# Patient Record
Sex: Female | Born: 1985 | Race: White | Hispanic: No | Marital: Single | Smoking: Current every day smoker
Health system: Southern US, Community
[De-identification: ages and names within clinical notes are randomized; demographics above are authoritative.]

## PROBLEM LIST (undated history)

## (undated) DIAGNOSIS — F32A Depression, unspecified: Secondary | ICD-10-CM

## (undated) DIAGNOSIS — F329 Major depressive disorder, single episode, unspecified: Secondary | ICD-10-CM

## (undated) DIAGNOSIS — D649 Anemia, unspecified: Secondary | ICD-10-CM

## (undated) DIAGNOSIS — N289 Disorder of kidney and ureter, unspecified: Secondary | ICD-10-CM

## (undated) DIAGNOSIS — F419 Anxiety disorder, unspecified: Secondary | ICD-10-CM

## (undated) HISTORY — PX: LITHOTRIPSY: SUR834

---

## 2018-03-19 ENCOUNTER — Emergency Department (HOSPITAL_COMMUNITY)
Admission: EM | Admit: 2018-03-19 | Discharge: 2018-03-19 | Disposition: A | Discharge status: Home / Self Care | Payer: Medicaid Other | Attending: Emergency Medicine | Admitting: Emergency Medicine

## 2018-03-19 ENCOUNTER — Other Ambulatory Visit: Payer: Self-pay

## 2018-03-19 ENCOUNTER — Encounter (HOSPITAL_COMMUNITY): Payer: Self-pay | Admitting: Emergency Medicine

## 2018-03-19 DIAGNOSIS — K0889 Other specified disorders of teeth and supporting structures: Secondary | ICD-10-CM | POA: Diagnosis present

## 2018-03-19 DIAGNOSIS — F17210 Nicotine dependence, cigarettes, uncomplicated: Secondary | ICD-10-CM | POA: Diagnosis not present

## 2018-03-19 DIAGNOSIS — J019 Acute sinusitis, unspecified: Secondary | ICD-10-CM | POA: Diagnosis not present

## 2018-03-19 HISTORY — DX: Anemia, unspecified: D64.9

## 2018-03-19 HISTORY — DX: Disorder of kidney and ureter, unspecified: N28.9

## 2018-03-19 HISTORY — DX: Depression, unspecified: F32.A

## 2018-03-19 HISTORY — DX: Anxiety disorder, unspecified: F41.9

## 2018-03-19 HISTORY — DX: Major depressive disorder, single episode, unspecified: F32.9

## 2018-03-19 MED ORDER — IBUPROFEN 400 MG PO TABS
600.0000 mg | ORAL_TABLET | Freq: Once | ORAL | Status: AC
  Administered 2018-03-19: 600 mg via ORAL
  Filled 2018-03-19: qty 2

## 2018-03-19 MED ORDER — CLINDAMYCIN HCL 150 MG PO CAPS: 150.0000 mg | ORAL_CAPSULE | Freq: Four times a day (QID) | ORAL | 0 refills | Status: DC

## 2018-03-19 MED ORDER — LORATADINE-PSEUDOEPHEDRINE ER 5-120 MG PO TB12: 1.0000 | ORAL_TABLET | Freq: Two times a day (BID) | ORAL | 0 refills | Status: DC

## 2018-03-19 MED ORDER — CLINDAMYCIN HCL 150 MG PO CAPS
300.0000 mg | ORAL_CAPSULE | Freq: Once | ORAL | Status: AC
  Administered 2018-03-19: 300 mg via ORAL
  Filled 2018-03-19: qty 2

## 2018-03-19 MED ORDER — ONDANSETRON HCL 4 MG PO TABS
4.0000 mg | ORAL_TABLET | Freq: Once | ORAL | Status: AC
  Administered 2018-03-19: 4 mg via ORAL
  Filled 2018-03-19: qty 1

## 2018-03-19 NOTE — Discharge Instructions (Addendum)
Please use clindamycin with breakfast, lunch, dinner, and at bedtime.  Take this daily until all taken.  Please use Claritin-D every 12 hours for sinus congestion and sinus pain.  Use 600 mg of ibuprofen with breakfast, lunch, dinner, and at bedtime.  It is important that she see a dentist as soon as possible.

## 2018-03-19 NOTE — ED Provider Notes (Addendum)
[facility] EMERGENCY DEPARTMENT Provider Note   CSN: [id number] Arrival date & time: 03/19/18  1817     History   Chief Complaint Chief Complaint  Patient presents with  . Dental Pain    HPI Ashley David is a 32 y.o. female.  Pt c/o increasing pain of the left face, behind the eye and the ear. She has nausea today.  The history is provided by the patient.  Dental Pain   This is a new problem. The current episode started more than 1 week ago. The problem occurs daily. The problem has been gradually worsening. The pain is moderate. She has tried acetaminophen for the symptoms.       Past Medical History:  Diagnosis Date  . Anemia   . Anxiety   . Depression   . Renal disorder    kidney stones    There are no active problems to display for this patient.   Past Surgical History:  Procedure Laterality Date  . LITHOTRIPSY       OB History   None      Home Medications    Prior to Admission medications   Not on File    Family History No family history on file.  Social History Social History   Tobacco Use  . Smoking status: Current Every Day Smoker    Packs/day: 1.00    Types: Cigarettes  . Smokeless tobacco: Never Used  Substance Use Topics  . Alcohol use: Not Currently  . Drug use: Not Currently     Allergies   Penicillins   Review of Systems Review of Systems  Constitutional: Negative for activity change.       All ROS Neg except as noted in HPI  HENT: Positive for congestion and dental problem. Negative for nosebleeds.   Eyes: Negative for photophobia and discharge.  Respiratory: Negative for cough, shortness of breath and wheezing.   Cardiovascular: Negative for chest pain and palpitations.  Gastrointestinal: Negative for abdominal pain and blood in stool.  Genitourinary: Negative for dysuria, frequency and hematuria.  Musculoskeletal: Negative for arthralgias, back pain and neck pain.  Skin: Negative.   Neurological: Negative  for dizziness, seizures and speech difficulty.  Psychiatric/Behavioral: Negative for confusion and hallucinations.     Physical Exam Updated Vital Signs BP 120/85 (BP Location: Right Arm)   Pulse 95   Temp 98.7 F (37.1 C) (Oral)   Resp 16   Ht 5\' 1"  (1.549 m)   Wt 62.1 kg (137 lb)   LMP 03/02/2018 (Approximate)   SpO2 98%   BMI 25.89 kg/m   Physical Exam  Constitutional: She is oriented to person, place, and time. She appears well-developed and well-nourished.  Non-toxic appearance.  HENT:  Head: Normocephalic.  Right Ear: Tympanic membrane and external ear normal.  Left Ear: Tympanic membrane and external ear normal.  There is pain to percussion over the sinuses on the left.  There is also pain to percussion of the left lower molars.  There is a cavity noted of 1 of the lower molars.  There is mild swelling about the lower gum.  There is no swelling under the tongue.  The airway is patent.  There are no pre-or post auricular nodes appreciated.  There is no mastoid involvement.  Eyes: Pupils are equal, round, and reactive to light. EOM and lids are normal.  Neck: Normal range of motion. Neck supple. Carotid bruit is not present.  Cardiovascular: Normal rate, regular rhythm, normal heart sounds, intact distal  pulses and normal pulses.  Pulmonary/Chest: Breath sounds normal. No respiratory distress.  Abdominal: Soft. Bowel sounds are normal. There is no tenderness. There is no guarding.  Musculoskeletal: Normal range of motion.  Lymphadenopathy:       Head (right side): No submandibular adenopathy present.       Head (left side): No submandibular adenopathy present.    She has no cervical adenopathy.  Neurological: She is alert and oriented to person, place, and time. She has normal strength. No cranial nerve deficit or sensory deficit.  Skin: Skin is warm and dry.  Psychiatric: She has a normal mood and affect. Her speech is normal.  Nursing note and vitals  reviewed.    ED Treatments / Results  Labs (all labs ordered are listed, but only abnormal results are displayed) Labs Reviewed - No data to display  EKG None  Radiology No results found.  Procedures Procedures (including critical care time)  Medications Ordered in ED Medications - No data to display   Initial Impression / Assessment and Plan / ED Course  I have reviewed the triage vital signs and the nursing notes.  Pertinent labs & imaging results that were available during my care of the patient were reviewed by me and considered in my medical decision making (see chart for details).       Final Clinical Impressions(s) / ED Diagnoses MDM  Vital signs within normal limits.  Patient has pain to percussion over the sinuses.  There is also noted dental caries on the left.  There is no evidence for Ludewig's angina.  Patient will be treated with clindamycin and ibuprofen for the dental discomfort.  Patient will use Claritin-D for the sinus congestion.  Patient is to follow-up with her primary physician or return to the emergency department if any changes in condition, problems, or concerns.   Final diagnoses:  Pain, dental  Acute sinusitis, recurrence not specified, unspecified location    ED Discharge Orders        Ordered    clindamycin (CLEOCIN) 150 MG capsule  Every 6 hours     03/19/18 2101    loratadine-pseudoephedrine (CLARITIN-D 12 HOUR) 5-120 MG tablet  2 times daily     03/19/18 2101       Ivery QualeBryant, Sherwood Castilla, PA-C 03/19/18 2108    Ivery QualeBryant, Era Parr, PA-C 03/22/18 [id number]    Donnetta Hutchingook, Brian, MD 03/24/18 2149

## 2018-03-19 NOTE — ED Triage Notes (Signed)
Pt c/o LT sided dental pain x 2 weeks. States pain radiates to LT ear and LT eye. Pt endorses nausea.

## 2018-08-27 ENCOUNTER — Other Ambulatory Visit: Payer: Self-pay

## 2018-08-27 ENCOUNTER — Encounter (HOSPITAL_COMMUNITY): Payer: Self-pay | Admitting: Emergency Medicine

## 2018-08-27 ENCOUNTER — Emergency Department (HOSPITAL_COMMUNITY)
Admission: EM | Admit: 2018-08-27 | Discharge: 2018-08-27 | Disposition: A | Discharge status: Home / Self Care | Payer: Medicaid Other | Attending: Emergency Medicine | Admitting: Emergency Medicine

## 2018-08-27 ENCOUNTER — Emergency Department (HOSPITAL_COMMUNITY): Payer: Medicaid Other

## 2018-08-27 DIAGNOSIS — Z79899 Other long term (current) drug therapy: Secondary | ICD-10-CM | POA: Diagnosis not present

## 2018-08-27 DIAGNOSIS — R1013 Epigastric pain: Secondary | ICD-10-CM | POA: Insufficient documentation

## 2018-08-27 DIAGNOSIS — F17210 Nicotine dependence, cigarettes, uncomplicated: Secondary | ICD-10-CM | POA: Insufficient documentation

## 2018-08-27 LAB — URINALYSIS, ROUTINE W REFLEX MICROSCOPIC
BILIRUBIN URINE: NEGATIVE
Glucose, UA: NEGATIVE mg/dL
Hgb urine dipstick: NEGATIVE
Ketones, ur: 20 mg/dL — AB
LEUKOCYTES UA: NEGATIVE
NITRITE: NEGATIVE
Protein, ur: NEGATIVE mg/dL
Specific Gravity, Urine: 1.017 (ref 1.005–1.030)
pH: 8 (ref 5.0–8.0)

## 2018-08-27 LAB — COMPREHENSIVE METABOLIC PANEL
ALK PHOS: 57 U/L (ref 38–126)
ALT: 15 U/L (ref 0–44)
AST: 16 U/L (ref 15–41)
Albumin: 4.4 g/dL (ref 3.5–5.0)
Anion gap: 6 (ref 5–15)
BILIRUBIN TOTAL: 0.7 mg/dL (ref 0.3–1.2)
BUN: 10 mg/dL (ref 6–20)
CALCIUM: 9.2 mg/dL (ref 8.9–10.3)
CO2: 26 mmol/L (ref 22–32)
Chloride: 106 mmol/L (ref 98–111)
Creatinine, Ser: 0.56 mg/dL (ref 0.44–1.00)
Glucose, Bld: 84 mg/dL (ref 70–99)
Potassium: 3.6 mmol/L (ref 3.5–5.1)
Sodium: 138 mmol/L (ref 135–145)
TOTAL PROTEIN: 7.4 g/dL (ref 6.5–8.1)

## 2018-08-27 LAB — CBC
HCT: 37.1 % (ref 36.0–46.0)
Hemoglobin: 12.1 g/dL (ref 12.0–15.0)
MCH: 30.3 pg (ref 26.0–34.0)
MCHC: 32.6 g/dL (ref 30.0–36.0)
MCV: 92.8 fL (ref 78.0–100.0)
Platelets: 375 10*3/uL (ref 150–400)
RBC: 4 MIL/uL (ref 3.87–5.11)
RDW: 13.2 % (ref 11.5–15.5)
WBC: 6.5 10*3/uL (ref 4.0–10.5)

## 2018-08-27 LAB — PREGNANCY, URINE: PREG TEST UR: NEGATIVE

## 2018-08-27 MED ORDER — ONDANSETRON HCL 4 MG/2ML IJ SOLN
4.0000 mg | Freq: Once | INTRAMUSCULAR | Status: AC
  Administered 2018-08-27: 4 mg via INTRAVENOUS
  Filled 2018-08-27: qty 2

## 2018-08-27 MED ORDER — TRAMADOL HCL 50 MG PO TABS: 50.0000 mg | ORAL_TABLET | Freq: Four times a day (QID) | ORAL | 0 refills | Status: DC | PRN

## 2018-08-27 MED ORDER — FENTANYL CITRATE (PF) 100 MCG/2ML IJ SOLN
25.0000 ug | Freq: Once | INTRAMUSCULAR | Status: AC
  Administered 2018-08-27: 25 ug via INTRAVENOUS
  Filled 2018-08-27: qty 2

## 2018-08-27 MED ORDER — ONDANSETRON 4 MG PO TBDP: 4.0000 mg | ORAL_TABLET | Freq: Three times a day (TID) | ORAL | 1 refills | Status: DC | PRN

## 2018-08-27 MED ORDER — FAMOTIDINE 20 MG PO TABS: 20.0000 mg | ORAL_TABLET | Freq: Two times a day (BID) | ORAL | 0 refills | Status: DC

## 2018-08-27 MED ORDER — SODIUM CHLORIDE 0.9 % IV SOLN
INTRAVENOUS | Status: DC
  Administered 2018-08-27: 15:00:00 via INTRAVENOUS

## 2018-08-27 MED ORDER — SODIUM CHLORIDE 0.9 % IV BOLUS
1000.0000 mL | Freq: Once | INTRAVENOUS | Status: AC
  Administered 2018-08-27: 1000 mL via INTRAVENOUS

## 2018-08-27 MED ORDER — IOPAMIDOL (ISOVUE-300) INJECTION 61%
100.0000 mL | Freq: Once | INTRAVENOUS | Status: AC | PRN
  Administered 2018-08-27: 100 mL via INTRAVENOUS

## 2018-08-27 NOTE — ED Triage Notes (Signed)
Pt states she has an IUD, in the last 4 months she has had bleeding everyday except maybe 4 days. Nausea, pain radiating in right flank.

## 2018-08-27 NOTE — Discharge Instructions (Signed)
Take the Pepcid as directed for the next 2 weeks.  Take the tramadol as needed for the pain.  Take Zofran as needed for the nausea and vomiting.  Today's work-up without any significant findings.  Return for any new or worse symptoms.

## 2018-08-27 NOTE — ED Provider Notes (Signed)
[facility] EMERGENCY DEPARTMENT Provider Note   CSN: [id number] Arrival date & time: 08/27/18  9562     History   Chief Complaint Chief Complaint  Patient presents with  . Abdominal Pain    HPI Ashley David is a 32 y.o. female.  Patient presents with a complaint of epigastric abdominal pain that radiates to the right flank.  Associated with nausea.  Some vomiting.  The symptoms have been ongoing for a week.  No fevers.  No dysuria.  No history of similar pain.  In addition patient has an IUD in place and she is been having heavy irregular bleeding for the past 4 months.  She does have an appointment already scheduled with [facility] for evaluation of this next week.  She does not feel that the abdominal pain is related to this.     Past Medical History:  Diagnosis Date  . Anemia   . Anxiety   . Depression   . Renal disorder    kidney stones    There are no active problems to display for this patient.   Past Surgical History:  Procedure Laterality Date  . LITHOTRIPSY       OB History   None      Home Medications    Prior to Admission medications   Medication Sig Start Date End Date Taking? Authorizing Provider  acetaminophen (TYLENOL) 500 MG tablet Take 500 mg by mouth every 6 (six) hours as needed for mild pain.   Yes [provider]  levonorgestrel (MIRENA) 20 MCG/24HR IUD 1 each by Intrauterine route once.   Yes [provider]  famotidine (PEPCID) 20 MG tablet Take 1 tablet (20 mg total) by mouth 2 (two) times daily. 08/27/18   Vanetta Mulders, MD  ondansetron (ZOFRAN ODT) 4 MG disintegrating tablet Take 1 tablet (4 mg total) by mouth every 8 (eight) hours as needed. 08/27/18   Vanetta Mulders, MD  traMADol (ULTRAM) 50 MG tablet Take 1 tablet (50 mg total) by mouth every 6 (six) hours as needed. 08/27/18   Vanetta Mulders, MD    Family History No family history on file.  Social History Social History   Tobacco Use  .  Smoking status: Current Every Day Smoker    Packs/day: 1.00    Types: Cigarettes  . Smokeless tobacco: Never Used  Substance Use Topics  . Alcohol use: Not Currently  . Drug use: Not Currently     Allergies   Penicillins   Review of Systems Review of Systems  Constitutional: Negative for fever.  HENT: Negative for congestion.   Eyes: Negative for redness.  Respiratory: Negative for shortness of breath.   Cardiovascular: Negative for chest pain.  Gastrointestinal: Positive for abdominal pain, nausea and vomiting.  Genitourinary: Positive for flank pain and vaginal bleeding. Negative for dysuria.  Musculoskeletal: Negative for back pain and myalgias.  Skin: Negative for rash.  Neurological: Negative for syncope.  Hematological: Does not bruise/bleed easily.  Psychiatric/Behavioral: Negative for confusion.     Physical Exam Updated Vital Signs BP 117/69 (BP Location: Left Arm)   Pulse 88   Temp 99.3 F (37.4 C) (Oral)   Resp 16   Ht 1.549 m (5\' 1" )   Wt 60 kg   SpO2 100%   BMI 25.00 kg/m   Physical Exam  Constitutional: She is oriented to person, place, and time. She appears well-developed and well-nourished. No distress.  HENT:  Head: Normocephalic and atraumatic.  Mouth/Throat: Oropharynx is clear and  moist.  Eyes: Pupils are equal, round, and reactive to light. Conjunctivae and EOM are normal.  Neck: Normal range of motion. Neck supple.  Cardiovascular: Normal rate, regular rhythm and normal heart sounds.  Pulmonary/Chest: Effort normal and breath sounds normal. No respiratory distress.  Abdominal: Soft. Bowel sounds are normal. There is tenderness.  Epigastric right upper quadrant area  Musculoskeletal: Normal range of motion. She exhibits no edema.  Neurological: She is alert and oriented to person, place, and time. No cranial nerve deficit or sensory deficit. She exhibits normal muscle tone. Coordination normal.  Skin: Skin is warm. Capillary refill takes  less than 2 seconds. No rash noted.  Nursing note and vitals reviewed.    ED Treatments / Results  Labs (all labs ordered are listed, but only abnormal results are displayed) Labs Reviewed  URINALYSIS, ROUTINE W REFLEX MICROSCOPIC - Abnormal; Notable for the following components:      Result Value   Ketones, ur 20 (*)    All other components within normal limits  COMPREHENSIVE METABOLIC PANEL  CBC  PREGNANCY, URINE    EKG None  Radiology Ct Abdomen Pelvis W Contrast  Result Date: 08/27/2018 CLINICAL DATA:  Epigastric pain for 1.5 weeks. EXAM: CT ABDOMEN AND PELVIS WITH CONTRAST TECHNIQUE: Multidetector CT imaging of the abdomen and pelvis was performed using the standard protocol following bolus administration of intravenous contrast. CONTRAST:  ISOVUE-300 IOPAMIDOL (ISOVUE-300) INJECTION 61% COMPARISON:  None. FINDINGS: Lower chest: Minimal atelectasis is identified in the right lung base. The heart size is normal. Hepatobiliary: There is low density of liver along the falciform ligament consistent with focal fatty infiltration of liver. No focal liver lesions identified. The gallbladder and biliary tree are normal. Pancreas: Unremarkable. No pancreatic ductal dilatation or surrounding inflammatory changes. Spleen: Normal in size without focal abnormality. Adrenals/Urinary Tract: Adrenal glands are unremarkable. Kidneys are normal, without renal calculi, focal lesion, or hydronephrosis. Bladder is unremarkable. Stomach/Bowel: There is a small hiatal hernia. Stomach is otherwise within normal limits. Appendix appears normal. No evidence of bowel wall thickening, distention, or inflammatory changes. Vascular/Lymphatic: No significant vascular findings are present. No enlarged abdominal or pelvic lymph nodes. Reproductive: Uterus and bilateral adnexa are unremarkable. IUD is identified in the uterus. Other: None. Musculoskeletal: No acute or significant osseous findings. IMPRESSION: No  acute abnormality identified in the abdomen and pelvis. No CT evidence of pancreatitis. Focal fatty infiltration of liver. Small hiatal hernia. Electronically Signed   By: Sherian Rein M.D.   On: 08/27/2018 16:28    Procedures Procedures (including critical care time)  Medications Ordered in ED Medications  0.9 %  sodium chloride infusion ( Intravenous New Bag/Given 08/27/18 1523)  ondansetron (ZOFRAN) injection 4 mg (4 mg Intravenous Given 08/27/18 1522)  sodium chloride 0.9 % bolus 1,000 mL (0 mLs Intravenous Stopped 08/27/18 1646)  fentaNYL (SUBLIMAZE) injection 25 mcg (25 mcg Intravenous Given 08/27/18 1522)  iopamidol (ISOVUE-300) 61 % injection 100 mL (100 mLs Intravenous Contrast Given 08/27/18 1558)     Initial Impression / Assessment and Plan / ED Course  I have reviewed the triage vital signs and the nursing notes.  Pertinent labs & imaging results that were available during my care of the patient were reviewed by me and considered in my medical decision making (see chart for details).    Patient's work-up for the new epigastric and right flank pain without any significant findings on CT scan of the abdomen.  Labs without significant abnormalities.  Urinalysis pregnancy test negative  CBC complete metabolic panel including hepatic functions without any significant abnormalities.  We will treat patient symptomatically.  Patient given Zofran for the nausea.  And a short course of tramadol for the pain.  Since pain was predominantly epigastric and CT scan can miss ulcers also treat patient with a 2-week course of Pepcid.  Patient will follow-up with GYN next week.  She also has primary care that she can follow-up with.  She will return for any new or worse symptoms.  Patient nontoxic no acute distress.  Final Clinical Impressions(s) / ED Diagnoses   Final diagnoses:  Epigastric pain    ED Discharge Orders         Ordered    traMADol (ULTRAM) 50 MG tablet  Every 6 hours PRN      08/27/18 1715    ondansetron (ZOFRAN ODT) 4 MG disintegrating tablet  Every 8 hours PRN     08/27/18 1715    famotidine (PEPCID) 20 MG tablet  2 times daily     08/27/18 1715           Vanetta Mulders, MD 08/27/18 2155

## 2018-09-04 ENCOUNTER — Encounter: Payer: Self-pay | Admitting: Women's Health

## 2018-09-04 ENCOUNTER — Ambulatory Visit: Payer: Medicaid Other | Admitting: Women's Health

## 2018-09-04 VITALS — BP 125/81 | HR 93 | Ht 61.0 in | Wt 134.0 lb

## 2018-09-04 DIAGNOSIS — B9689 Other specified bacterial agents as the cause of diseases classified elsewhere: Secondary | ICD-10-CM | POA: Diagnosis not present

## 2018-09-04 DIAGNOSIS — Z3202 Encounter for pregnancy test, result negative: Secondary | ICD-10-CM

## 2018-09-04 DIAGNOSIS — Z30432 Encounter for removal of intrauterine contraceptive device: Secondary | ICD-10-CM

## 2018-09-04 DIAGNOSIS — L292 Pruritus vulvae: Secondary | ICD-10-CM | POA: Diagnosis not present

## 2018-09-04 DIAGNOSIS — T8332XA Displacement of intrauterine contraceptive device, initial encounter: Secondary | ICD-10-CM | POA: Diagnosis not present

## 2018-09-04 DIAGNOSIS — N939 Abnormal uterine and vaginal bleeding, unspecified: Secondary | ICD-10-CM | POA: Diagnosis not present

## 2018-09-04 DIAGNOSIS — Z30013 Encounter for initial prescription of injectable contraceptive: Secondary | ICD-10-CM

## 2018-09-04 DIAGNOSIS — N760 Acute vaginitis: Secondary | ICD-10-CM

## 2018-09-04 LAB — POCT WET PREP (WET MOUNT)
CLUE CELLS WET PREP WHIFF POC: POSITIVE
Trichomonas Wet Prep HPF POC: ABSENT

## 2018-09-04 LAB — POCT URINE PREGNANCY: Preg Test, Ur: NEGATIVE

## 2018-09-04 LAB — POCT HEMOGLOBIN: Hemoglobin: 12.3 g/dL (ref 12.2–16.2)

## 2018-09-04 MED ORDER — MEDROXYPROGESTERONE ACETATE 150 MG/ML IM SUSP: 150.0000 mg | INTRAMUSCULAR | 3 refills | Status: DC

## 2018-09-04 MED ORDER — METRONIDAZOLE 500 MG PO TABS: 500.0000 mg | ORAL_TABLET | Freq: Two times a day (BID) | ORAL | 0 refills | Status: DC

## 2018-09-04 NOTE — Patient Instructions (Signed)
NO SEX UNTIL AFTER YOU GET YOUR BIRTH CONTROL   Medroxyprogesterone injection [Contraceptive] What is this medicine? MEDROXYPROGESTERONE (me DROX ee proe JES te rone) contraceptive injections prevent pregnancy. They provide effective birth control for 3 months. Depo-subQ Provera 104 is also used for treating pain related to endometriosis. This medicine may be used for other purposes; ask your health care provider or pharmacist if you have questions. COMMON BRAND NAME(S): Depo-Provera, Depo-subQ Provera 104 What should I tell my health care provider before I take this medicine? They need to know if you have any of these conditions: -frequently drink alcohol -asthma -blood vessel disease or a history of a blood clot in the lungs or legs -bone disease such as osteoporosis -breast cancer -diabetes -eating disorder (anorexia nervosa or bulimia) -high blood pressure -HIV infection or AIDS -kidney disease -liver disease -mental depression -migraine -seizures (convulsions) -stroke -tobacco smoker -vaginal bleeding -an unusual or allergic reaction to medroxyprogesterone, other hormones, medicines, foods, dyes, or preservatives -pregnant or trying to get pregnant -breast-feeding How should I use this medicine? Depo-Provera Contraceptive injection is given into a muscle. Depo-subQ Provera 104 injection is given under the skin. These injections are given by a health care professional. You must not be pregnant before getting an injection. The injection is usually given during the first 5 days after the start of a menstrual period or 6 weeks after delivery of a baby. Talk to your pediatrician regarding the use of this medicine in children. Special care may be needed. These injections have been used in female children who have started having menstrual periods. Overdosage: If you think you have taken too much of this medicine contact a poison control center or emergency room at once. NOTE: This  medicine is only for you. Do not share this medicine with others. What if I miss a dose? Try not to miss a dose. You must get an injection once every 3 months to maintain birth control. If you cannot keep an appointment, call and reschedule it. If you wait longer than 13 weeks between Depo-Provera contraceptive injections or longer than 14 weeks between Depo-subQ Provera 104 injections, you could get pregnant. Use another method for birth control if you miss your appointment. You may also need a pregnancy test before receiving another injection. What may interact with this medicine? Do not take this medicine with any of the following medications: -bosentan This medicine may also interact with the following medications: -aminoglutethimide -antibiotics or medicines for infections, especially rifampin, rifabutin, rifapentine, and griseofulvin -aprepitant -barbiturate medicines such as phenobarbital or primidone -bexarotene -carbamazepine -medicines for seizures like ethotoin, felbamate, oxcarbazepine, phenytoin, topiramate -modafinil -St. John's wort This list may not describe all possible interactions. Give your health care provider a list of all the medicines, herbs, non-prescription drugs, or dietary supplements you use. Also tell them if you smoke, drink alcohol, or use illegal drugs. Some items may interact with your medicine. What should I watch for while using this medicine? This drug does not protect you against HIV infection (AIDS) or other sexually transmitted diseases. Use of this product may cause you to lose calcium from your bones. Loss of calcium may cause weak bones (osteoporosis). Only use this product for more than 2 years if other forms of birth control are not right for you. The longer you use this product for birth control the more likely you will be at risk for weak bones. Ask your health care professional how you can keep strong bones. You may have a change   in bleeding pattern  or irregular periods. Many females stop having periods while taking this drug. If you have received your injections on time, your chance of being pregnant is very low. If you think you may be pregnant, see your health care professional as soon as possible. Tell your health care professional if you want to get pregnant within the next year. The effect of this medicine may last a long time after you get your last injection. What side effects may I notice from receiving this medicine? Side effects that you should report to your doctor or health care professional as soon as possible: -allergic reactions like skin rash, itching or hives, swelling of the face, lips, or tongue -breast tenderness or discharge -breathing problems -changes in vision -depression -feeling faint or lightheaded, falls -fever -pain in the abdomen, chest, groin, or leg -problems with balance, talking, walking -unusually weak or tired -yellowing of the eyes or skin Side effects that usually do not require medical attention (report to your doctor or health care professional if they continue or are bothersome): -acne -fluid retention and swelling -headache -irregular periods, spotting, or absent periods -temporary pain, itching, or skin reaction at site where injected -weight gain This list may not describe all possible side effects. Call your doctor for medical advice about side effects. You may report side effects to FDA at 1-800-FDA-1088. Where should I keep my medicine? This does not apply. The injection will be given to you by a health care professional. NOTE: This sheet is a summary. It may not cover all possible information. If you have questions about this medicine, talk to your doctor, pharmacist, or health care provider.  2018 Elsevier/Gold Standard (2008-12-09 18:37:56)  

## 2018-09-04 NOTE — Progress Notes (Signed)
GYN VISIT Patient name: Ashley David MRN [id number]  Date of birth: 1986-06-19 Chief Complaint:   problems with IUD (has been bleeding x 4 months; nausea and stomach pain; vaginal itching )  History of Present Illness:   Ashley David is a 32 y.o. [id number] Caucasian female being seen today as a new pt for report of bleeding almost daily x 4 months, may stop for 3d but then starts right back.  Some intermittent nausea/stomach pain. Vaginal itching x , no odor. Had IUD placed at [facility] Oct 2018, has had normal periods until ago. Went to ED 08/27/18 w/ abd pain, Abd CT revealed focal fatty infiltration of liver, small hiatal hernia, IUD 'identified in uterus'. Was given rx for zofran, tramadol and pepcid for possible ulcer, stopped pepcid b/c made sx worse.   No LMP recorded. (Menstrual status: IUD). The current method of family planning is IUD. Last pap Oct 2018 at [facility]. Results were:  normal Review of Systems:   Pertinent items are noted in HPI Denies fever/chills, dizziness, headaches, visual disturbances, fatigue, shortness of breath, chest pain, abdominal pain, vomiting, abnormal vaginal discharge/itching/odor/irritation, problems with periods, bowel movements, urination, or intercourse unless otherwise stated above.  Pertinent History Reviewed:  Reviewed past medical,surgical, social, obstetrical and family history.  Reviewed problem list, medications and allergies. Physical Assessment:   Vitals:   09/04/18 1158  BP: 125/81  Pulse: 93  Weight: 134 lb (60.8 kg)  Height: 5\' 1"  (1.549 m)  Body mass index is 25.32 kg/m.       Physical Examination:   General appearance: alert, well appearing, and in no distress  Mental status: alert, oriented to person, place, and time  Skin: warm & dry   Cardiovascular: normal heart rate noted  Respiratory: normal respiratory effort, no distress  Abdomen: soft, non-tender   Pelvic: VULVA: normal appearing vulva with no masses,  tenderness or lesions, VAGINA: normal appearing vagina with normal color and scant amt slightly malodorous discharge, no lesions, CERVIX: normal appearing cervix without discharge or lesions, IUD strings visible  Extremities: no edema   Informal transvaginal u/s: IUD in mid to lower uterine segment w/in endometrium, confirmed by [facility], ultrasonographer. Discussed w/ pt, pt wants to remove and start depo.  Results for orders placed or performed in visit on 09/04/18 (from the past 24 hour(s))  POCT urine pregnancy   Collection Time: 09/04/18 12:05 PM  Result Value Ref Range   Preg Test, Ur Negative Negative  POCT hemoglobin   Collection Time: 09/04/18 12:05 PM  Result Value Ref Range   Hemoglobin 12.3 12.2 - 16.2 g/dL  POCT Wet Prep [facility])   Collection Time: 09/04/18  1:04 PM  Result Value Ref Range   Source Wet Prep POC vaginal    WBC, Wet Prep HPF POC few    Bacteria Wet Prep HPF POC Few Few   BACTERIA WET PREP MORPHOLOGY POC     Clue Cells Wet Prep HPF POC Moderate (A) None   Clue Cells Wet Prep Whiff POC Positive Whiff    Yeast Wet Prep HPF POC None None   KOH Wet Prep POC     Trichomonas Wet Prep HPF POC Absent Absent    Time out was performed.  A graves speculum was placed in the vagina.  The cervix was visualized, and the strings were visible. They were grasped and the Mirena IUD was easily removed intact without complications. The patient tolerated the procedure well.   Assessment & Plan:  1) Malpositioned IUD, removed today> per pt request.   2) Bleeding x 41mths> likely from malpositioned IUD  3) BV> Rx metronidazole 500mg  BID x 7d for BV, no sex or etoh while taking   4) Contraception management> Rx depo, no sex until returns for injection. Condoms x 2wks after 1st injection   Meds:  Meds ordered this encounter  Medications  . medroxyPROGESTERone (DEPO-PROVERA) 150 MG/ML injection    Sig: Inject 1 mL (150 mg total) into the muscle every 3 (three) months.     Dispense:  1 mL    Refill:  3    Order Specific Question:   Supervising Provider    Answer:   EURE, LUTHER H [[id number]]  . metroNIDAZOLE (FLAGYL) 500 MG tablet    Sig: Take 1 tablet (500 mg total) by mouth 2 (two) times daily.    Dispense:  14 tablet    Refill:  0    Order Specific Question:   Supervising Provider    Answer:   Duane Lope H [[id number]]    Orders Placed This Encounter  Procedures  . GC/Chlamydia Probe Amp  . POCT urine pregnancy  . POCT hemoglobin  . POCT Wet Prep [facility])    Return for asap for depo, then 15yr for physical.  Cheral Marker CNM, [facility] 09/04/2018 1:05 PM

## 2018-09-07 ENCOUNTER — Ambulatory Visit: Payer: Medicaid Other

## 2018-09-07 ENCOUNTER — Ambulatory Visit (INDEPENDENT_AMBULATORY_CARE_PROVIDER_SITE_OTHER): Payer: Medicaid Other

## 2018-09-07 VITALS — Ht 61.0 in | Wt 133.2 lb

## 2018-09-07 DIAGNOSIS — Z3202 Encounter for pregnancy test, result negative: Secondary | ICD-10-CM | POA: Diagnosis not present

## 2018-09-07 DIAGNOSIS — Z3042 Encounter for surveillance of injectable contraceptive: Secondary | ICD-10-CM | POA: Diagnosis not present

## 2018-09-07 LAB — POCT URINE PREGNANCY: Preg Test, Ur: NEGATIVE

## 2018-09-07 LAB — GC/CHLAMYDIA PROBE AMP
Chlamydia trachomatis, NAA: NEGATIVE
Neisseria gonorrhoeae by PCR: NEGATIVE

## 2018-09-07 MED ORDER — MEDROXYPROGESTERONE ACETATE 150 MG/ML IM SUSP
150.0000 mg | Freq: Once | INTRAMUSCULAR | Status: AC
  Administered 2018-09-07: 150 mg via INTRAMUSCULAR

## 2018-09-07 NOTE — Progress Notes (Signed)
Pt here for depo injection 150 mg IM given lt deltoid. Tolerated well. Return 12 weeks for injection. Pad CMA 

## 2018-11-30 ENCOUNTER — Ambulatory Visit: Payer: Medicaid Other

## 2018-12-04 ENCOUNTER — Other Ambulatory Visit: Payer: Self-pay

## 2018-12-04 ENCOUNTER — Ambulatory Visit (INDEPENDENT_AMBULATORY_CARE_PROVIDER_SITE_OTHER): Payer: Medicaid Other | Admitting: *Deleted

## 2018-12-04 DIAGNOSIS — Z3202 Encounter for pregnancy test, result negative: Secondary | ICD-10-CM | POA: Diagnosis not present

## 2018-12-04 DIAGNOSIS — Z3042 Encounter for surveillance of injectable contraceptive: Secondary | ICD-10-CM

## 2018-12-04 LAB — POCT URINE PREGNANCY: Preg Test, Ur: NEGATIVE

## 2018-12-04 MED ORDER — MEDROXYPROGESTERONE ACETATE 150 MG/ML IM SUSP
150.0000 mg | Freq: Once | INTRAMUSCULAR | Status: AC
  Administered 2018-12-04: 150 mg via INTRAMUSCULAR

## 2018-12-04 NOTE — Progress Notes (Signed)
Pt given DepoProvera 150mg IM left deltoid without complications. Advised to return in 12 weeks for next injection. 

## 2019-02-26 ENCOUNTER — Ambulatory Visit: Payer: Self-pay

## 2019-04-08 ENCOUNTER — Encounter (HOSPITAL_COMMUNITY): Payer: Self-pay

## 2019-04-08 ENCOUNTER — Emergency Department (HOSPITAL_COMMUNITY): Payer: Medicaid Other

## 2019-04-08 ENCOUNTER — Other Ambulatory Visit: Payer: Self-pay

## 2019-04-08 ENCOUNTER — Emergency Department (HOSPITAL_COMMUNITY)
Admission: EM | Admit: 2019-04-08 | Discharge: 2019-04-08 | Disposition: A | Discharge status: Home / Self Care | Payer: Medicaid Other | Attending: Emergency Medicine | Admitting: Emergency Medicine

## 2019-04-08 DIAGNOSIS — R1013 Epigastric pain: Secondary | ICD-10-CM | POA: Diagnosis not present

## 2019-04-08 DIAGNOSIS — R1084 Generalized abdominal pain: Secondary | ICD-10-CM

## 2019-04-08 DIAGNOSIS — R109 Unspecified abdominal pain: Secondary | ICD-10-CM | POA: Diagnosis present

## 2019-04-08 DIAGNOSIS — R350 Frequency of micturition: Secondary | ICD-10-CM | POA: Insufficient documentation

## 2019-04-08 DIAGNOSIS — R112 Nausea with vomiting, unspecified: Secondary | ICD-10-CM | POA: Diagnosis not present

## 2019-04-08 DIAGNOSIS — F509 Eating disorder, unspecified: Secondary | ICD-10-CM | POA: Diagnosis not present

## 2019-04-08 DIAGNOSIS — R197 Diarrhea, unspecified: Secondary | ICD-10-CM | POA: Insufficient documentation

## 2019-04-08 DIAGNOSIS — F17210 Nicotine dependence, cigarettes, uncomplicated: Secondary | ICD-10-CM | POA: Insufficient documentation

## 2019-04-08 DIAGNOSIS — R10A1 Flank pain, right side: Secondary | ICD-10-CM

## 2019-04-08 LAB — URINALYSIS, ROUTINE W REFLEX MICROSCOPIC
Bacteria, UA: NONE SEEN
Bilirubin Urine: NEGATIVE
Glucose, UA: NEGATIVE mg/dL
Ketones, ur: NEGATIVE mg/dL
Leukocytes,Ua: NEGATIVE
Nitrite: NEGATIVE
Protein, ur: NEGATIVE mg/dL
Specific Gravity, Urine: 1.001 — ABNORMAL LOW (ref 1.005–1.030)
pH: 7 (ref 5.0–8.0)

## 2019-04-08 LAB — COMPREHENSIVE METABOLIC PANEL
ALT: 19 U/L (ref 0–44)
AST: 20 U/L (ref 15–41)
Albumin: 4.3 g/dL (ref 3.5–5.0)
Alkaline Phosphatase: 48 U/L (ref 38–126)
Anion gap: 10 (ref 5–15)
BUN: 7 mg/dL (ref 6–20)
CO2: 25 mmol/L (ref 22–32)
Calcium: 9.4 mg/dL (ref 8.9–10.3)
Chloride: 104 mmol/L (ref 98–111)
Creatinine, Ser: 0.43 mg/dL — ABNORMAL LOW (ref 0.44–1.00)
GFR calc Af Amer: >60 mL/min (ref >60)
GFR calc non Af Amer: >60 mL/min (ref >60)
Glucose, Bld: 91 mg/dL (ref 70–99)
Potassium: 3.3 mmol/L — ABNORMAL LOW (ref 3.5–5.1)
Sodium: 139 mmol/L (ref 135–145)
Total Bilirubin: 0.3 mg/dL (ref 0.3–1.2)
Total Protein: 7.2 g/dL (ref 6.5–8.1)

## 2019-04-08 LAB — CBC WITH DIFFERENTIAL/PLATELET
Abs Immature Granulocytes: 0.02 10*3/uL (ref 0.00–0.07)
Basophils Absolute: 0 10*3/uL (ref 0.0–0.1)
Basophils Relative: 0 %
Eosinophils Absolute: 0.2 10*3/uL (ref 0.0–0.5)
Eosinophils Relative: 2 %
HCT: 36.7 % (ref 36.0–46.0)
Hemoglobin: 11.7 g/dL — ABNORMAL LOW (ref 12.0–15.0)
Immature Granulocytes: 0 %
Lymphocytes Relative: 23 %
Lymphs Abs: 1.9 10*3/uL (ref 0.7–4.0)
MCH: 30.8 pg (ref 26.0–34.0)
MCHC: 31.9 g/dL (ref 30.0–36.0)
MCV: 96.6 fL (ref 80.0–100.0)
Monocytes Absolute: 0.6 10*3/uL (ref 0.1–1.0)
Monocytes Relative: 7 %
Neutro Abs: 5.5 10*3/uL (ref 1.7–7.7)
Neutrophils Relative %: 68 %
Platelets: 393 10*3/uL (ref 150–400)
RBC: 3.8 MIL/uL — ABNORMAL LOW (ref 3.87–5.11)
RDW: 12.9 % (ref 11.5–15.5)
WBC: 8.2 10*3/uL (ref 4.0–10.5)
nRBC: 0 % (ref 0.0–0.2)

## 2019-04-08 LAB — PREGNANCY, URINE: Preg Test, Ur: NEGATIVE

## 2019-04-08 LAB — LIPASE, BLOOD: Lipase: 30 U/L (ref 11–51)

## 2019-04-08 MED ORDER — LIDOCAINE VISCOUS HCL 2 % MT SOLN
15.0000 mL | Freq: Once | OROMUCOSAL | Status: AC
  Administered 2019-04-08: 15:00:00 15 mL via ORAL
  Filled 2019-04-08: qty 15

## 2019-04-08 MED ORDER — PANTOPRAZOLE SODIUM 40 MG IV SOLR
40.0000 mg | Freq: Once | INTRAVENOUS | Status: AC
  Administered 2019-04-08: 15:00:00 40 mg via INTRAVENOUS
  Filled 2019-04-08: qty 40

## 2019-04-08 MED ORDER — OMEPRAZOLE 40 MG PO CPDR: 40.0000 mg | DELAYED_RELEASE_CAPSULE | Freq: Every day | ORAL | 0 refills | Status: DC

## 2019-04-08 MED ORDER — POTASSIUM CHLORIDE CRYS ER 20 MEQ PO TBCR
40.0000 meq | EXTENDED_RELEASE_TABLET | Freq: Once | ORAL | Status: AC
  Administered 2019-04-08: 16:00:00 40 meq via ORAL
  Filled 2019-04-08: qty 2

## 2019-04-08 MED ORDER — ONDANSETRON HCL 4 MG PO TABS: 4.0000 mg | ORAL_TABLET | Freq: Four times a day (QID) | ORAL | 0 refills | Status: DC | PRN

## 2019-04-08 MED ORDER — ONDANSETRON HCL 4 MG/2ML IJ SOLN
4.0000 mg | Freq: Once | INTRAMUSCULAR | Status: AC
  Administered 2019-04-08: 11:00:00 4 mg via INTRAVENOUS
  Filled 2019-04-08: qty 2

## 2019-04-08 MED ORDER — SODIUM CHLORIDE 0.9 % IV BOLUS
500.0000 mL | Freq: Once | INTRAVENOUS | Status: AC
  Administered 2019-04-08: 11:00:00 500 mL via INTRAVENOUS

## 2019-04-08 MED ORDER — ALUM & MAG HYDROXIDE-SIMETH 200-200-20 MG/5ML PO SUSP
30.0000 mL | Freq: Once | ORAL | Status: AC
  Administered 2019-04-08: 30 mL via ORAL
  Filled 2019-04-08: qty 30

## 2019-04-08 MED ORDER — IOHEXOL 300 MG/ML  SOLN
100.0000 mL | Freq: Once | INTRAMUSCULAR | Status: AC | PRN
  Administered 2019-04-08: 15:00:00 100 mL via INTRAVENOUS

## 2019-04-08 MED ORDER — SUCRALFATE 1 GM/10ML PO SUSP: 1.0000 g | Freq: Three times a day (TID) | ORAL | 0 refills | Status: DC

## 2019-04-08 MED ORDER — MORPHINE SULFATE (PF) 4 MG/ML IV SOLN
4.0000 mg | Freq: Once | INTRAVENOUS | Status: AC
  Administered 2019-04-08: 13:00:00 4 mg via INTRAVENOUS
  Filled 2019-04-08: qty 1

## 2019-04-08 MED ORDER — HYDROCODONE-ACETAMINOPHEN 5-325 MG PO TABS: 1.0000 | ORAL_TABLET | Freq: Four times a day (QID) | ORAL | 0 refills | Status: DC | PRN

## 2019-04-08 NOTE — ED Provider Notes (Signed)
[facility] EMERGENCY DEPARTMENT Provider Note   CSN: [id number] Arrival date & time: 04/08/19  1751    History   Chief Complaint Chief Complaint  Patient presents with   Abdominal Pain    HPI Ashley David is a 33 y.o. female with history of anxiety, depression, anemia, kidney stones who presents with a one-week history of right flank pain and mid abdominal pain and swelling.  She describes the pain is mostly constant and sharp.  Patient reports she has had associated nausea and some vomiting in the of any of the week, however that has resolved.  She has had some looser stools.  No bloody stools.  She denies any abnormal vaginal bleeding or discharge.  She reports some urinary frequency.  She reports feeling warm, but no fever documented at home.  She has been taking Tylenol without relief.  Patient reports this does not feel like a kidney stone. It does not seem to be associated with food. Patient had drank Dr. Reino Kent and a strawberry muffin prior to worsening pain today.     HPI  Past Medical History:  Diagnosis Date   Anemia    Anxiety    Depression    Renal disorder    kidney stones    There are no active problems to display for this patient.   Past Surgical History:  Procedure Laterality Date   LITHOTRIPSY       OB History    Gravida  2   Para  2   Term  2   Preterm      AB      Living  2     SAB      TAB      Ectopic      Multiple      Live Births  2            Home Medications    Prior to Admission medications   Medication Sig Start Date End Date Taking? Authorizing Provider  acetaminophen (TYLENOL) 500 MG tablet Take 1,000 mg by mouth every 6 (six) hours as needed for mild pain.   Yes [provider]  HYDROcodone-acetaminophen (NORCO/VICODIN) 5-325 MG tablet Take 1 tablet by mouth every 6 (six) hours as needed for severe pain. 04/08/19   Clester Chlebowski, Waylan Boga, PA-C  medroxyPROGESTERone (DEPO-PROVERA) 150 MG/ML injection  Inject 1 mL (150 mg total) into the muscle every 3 (three) months. Patient not taking: Reported on 04/08/2019 09/04/18   Cheral Marker, CNM  omeprazole (PRILOSEC) 40 MG capsule Take 1 capsule (40 mg total) by mouth daily. 04/08/19   Rakesh Dutko, Waylan Boga, PA-C  ondansetron (ZOFRAN) 4 MG tablet Take 1 tablet (4 mg total) by mouth every 6 (six) hours as needed for nausea or vomiting. 04/08/19   Marilu Rylander, Waylan Boga, PA-C  sucralfate (CARAFATE) 1 GM/10ML suspension Take 10 mLs (1 g total) by mouth 4 (four) times daily -  with meals and at bedtime. 04/08/19   Emi Holes, PA-C    Family History Family History  Problem Relation Age of Onset   Congestive Heart Failure Maternal Grandmother    Congestive Heart Failure Maternal Grandfather    Diabetes Brother     Social History Social History   Tobacco Use   Smoking status: Current Every Day Smoker    Packs/day: 1.00    Years: 12.00    Pack years: 12.00    Types: Cigarettes   Smokeless tobacco: Never Used  Substance Use Topics  Alcohol use: Not Currently   Drug use: Never     Allergies   Amoxicillin; Penicillins; and Tequin [gatifloxacin]   Review of Systems Review of Systems  Constitutional: Positive for fever (subjective). Negative for chills.  HENT: Negative for facial swelling and sore throat.   Respiratory: Negative for shortness of breath.   Cardiovascular: Negative for chest pain.  Gastrointestinal: Positive for abdominal pain, diarrhea, nausea and vomiting. Negative for blood in stool.  Genitourinary: Positive for flank pain and frequency. Negative for dysuria.  Musculoskeletal: Positive for back pain.  Skin: Negative for rash and wound.  Neurological: Negative for headaches.  Psychiatric/Behavioral: The patient is not nervous/anxious.      Physical Exam Updated Vital Signs BP (!) 106/56    Pulse 71    Temp 97.9 F (36.6 C) (Oral)    Resp 17    Ht  (1.549 m)    Wt 56.7 kg    LMP 03/22/2019 Comment: neg preg    SpO2 100%    BMI 23.62 kg/m   Physical Exam Vitals signs and nursing note reviewed.  Constitutional:      General: She is not in acute distress.    Appearance: She is well-developed. She is not diaphoretic.  HENT:     Head: Normocephalic and atraumatic.     Mouth/Throat:     Pharynx: No oropharyngeal exudate.  Eyes:     General: No scleral icterus.       Right eye: No discharge.        Left eye: No discharge.     Conjunctiva/sclera: Conjunctivae normal.     Pupils: Pupils are equal, round, and reactive to light.  Neck:     Musculoskeletal: Normal range of motion and neck supple.     Thyroid: No thyromegaly.  Cardiovascular:     Rate and Rhythm: Normal rate and regular rhythm.     Heart sounds: Normal heart sounds. No murmur. No friction rub. No gallop.   Pulmonary:     Effort: Pulmonary effort is normal. No respiratory distress.     Breath sounds: Normal breath sounds. No stridor. No wheezing or rales.  Abdominal:     General: Bowel sounds are normal. There is no distension.     Palpations: Abdomen is soft. There is no pulsatile mass.     Tenderness: There is abdominal tenderness (mild) in the epigastric area and suprapubic area. There is no right CVA tenderness, left CVA tenderness, guarding or rebound.  Lymphadenopathy:     Cervical: No cervical adenopathy.  Skin:    General: Skin is warm and dry.     Coloration: Skin is not pale.     Findings: No rash.  Neurological:     Mental Status: She is alert.     Coordination: Coordination normal.      ED Treatments / Results  Labs (all labs ordered are listed, but only abnormal results are displayed) Labs Reviewed  COMPREHENSIVE METABOLIC PANEL - Abnormal; Notable for the following components:      Result Value   Potassium 3.3 (*)    Creatinine, Ser 0.43 (*)    All other components within normal limits  CBC WITH DIFFERENTIAL/PLATELET - Abnormal; Notable for the following components:   RBC 3.80 (*)    Hemoglobin 11.7  (*)    All other components within normal limits  URINALYSIS, ROUTINE W REFLEX MICROSCOPIC - Abnormal; Notable for the following components:   Color, Urine STRAW (*)    Specific Gravity, Urine  1.001 (*)    Hgb urine dipstick SMALL (*)    All other components within normal limits  LIPASE, BLOOD  PREGNANCY, URINE    EKG None  Radiology Ct Abdomen Pelvis W Contrast  Result Date: 04/08/2019 CLINICAL DATA:  Mid abdominal pain, right flank pain EXAM: CT ABDOMEN AND PELVIS WITH CONTRAST TECHNIQUE: Multidetector CT imaging of the abdomen and pelvis was performed using the standard protocol following bolus administration of intravenous contrast. CONTRAST:  100mL OMNIPAQUE IOHEXOL 300 MG/ML  SOLN COMPARISON:  08/27/2018 FINDINGS: Lower chest: No acute abnormality. Hepatobiliary: No focal liver abnormality is seen. No gallstones, gallbladder wall thickening, or biliary dilatation. Pancreas: Unremarkable. No pancreatic ductal dilatation or surrounding inflammatory changes. Spleen: Normal in size without focal abnormality. Adrenals/Urinary Tract: Adrenal glands are unremarkable. Kidneys are normal, without renal calculi, focal lesion, or hydronephrosis. Bladder is unremarkable. Stomach/Bowel: Stomach is within normal limits. Appendix appears normal. No evidence of bowel wall thickening, distention, or inflammatory changes. Vascular/Lymphatic: No significant vascular findings are present. No enlarged abdominal or pelvic lymph nodes. Reproductive: No mass or other abnormality. Other: No abdominal wall hernia or abnormality. No abdominopelvic ascites. Musculoskeletal: No acute or significant osseous findings. IMPRESSION: No CT findings of the abdomen or pelvis to explain mid abdominal or right flank pain. There is no evidence of urinary tract calculus or hydronephrosis. Normal appendix. Electronically Signed   By: Lauralyn PrimesAlex  Bibbey M.D.   On: 04/08/2019 15:06    Procedures Procedures (including critical care  time)  Medications Ordered in ED Medications  potassium chloride SA (K-DUR) CR tablet 40 mEq (has no administration in time range)  sodium chloride 0.9 % bolus 500 mL (0 mLs Intravenous Stopped 04/08/19 1211)  ondansetron (ZOFRAN) injection 4 mg (4 mg Intravenous Given 04/08/19 1030)  morphine 4 MG/ML injection 4 mg (4 mg Intravenous Given 04/08/19 1244)  iohexol (OMNIPAQUE) 300 MG/ML solution 100 mL (100 mLs Intravenous Contrast Given 04/08/19 1435)  alum & mag hydroxide-simeth (MAALOX/MYLANTA) 200-200-20 MG/5ML suspension 30 mL (30 mLs Oral Given 04/08/19 1529)    And  lidocaine (XYLOCAINE) 2 % viscous mouth solution 15 mL (15 mLs Oral Given 04/08/19 1528)  pantoprazole (PROTONIX) injection 40 mg (40 mg Intravenous Given 04/08/19 1528)     Initial Impression / Assessment and Plan / ED Course  I have reviewed the triage vital signs and the nursing notes.  Pertinent labs & imaging results that were available during my care of the patient were reviewed by me and considered in my medical decision making (see chart for details).  Clinical Course as of Apr 08 1607  Thu Apr 08, 2019  1244 On reassessment, patient reports her pain is a 10/10.  It is worse in the right back.  On repeat abdominal exam, patient has moderate tenderness in the periumbilical and right lower quadrant.  Concern for appendicitis.  CTAP ordered.   [AL]  1519 CT is negative for acute findings.  Patient's pain is improved, however she is still having some pain.  Question possible ulcer or reflux disease.  Will trial GI cocktail and Protonix.  Plan to discharge home with follow-up to GI if passing p.o. challenge.   [AL]    Clinical Course User Index [AL] Emi HolesLaw, Waneda Klammer M, PA-C       Patient presenting with 1 week of abdominal pain and right flank pain.  Patient reports swelling of her epigastric area.  She has had associated nausea and vomiting in the beginning the week, but now just nausea.  She  has no reproducible flank pain, no  CVA tenderness.  She did have some right lower quadrant and periumbilical tenderness.  CT is negative for acute findings.  On reevaluation after CT, patient reports her pain is returning and is more in the epigastric area.  Will try GI cocktail and Protonix.  Patient's pain improved after this.  She is tolerating oral fluids.  We will treat for possible ulcer versus GERD.  Discharged home with omeprazole, Carafate, Zofran, and short course of Norco.  I reviewed the  narcotic database and found no discrepancies.  Patient vies to follow-up with PCP and GI for further evaluation.  Diet modification discussed.  She understands and agrees with plan.  Return precautions discussed.  Patient vital stable throughout ED course and discharged in satisfactory condition.  Final Clinical Impressions(s) / ED Diagnoses   Final diagnoses:  Generalized abdominal pain  Right flank pain  Non-intractable vomiting with nausea, unspecified vomiting type    ED Discharge Orders         Ordered    sucralfate (CARAFATE) 1 GM/10ML suspension  3 times daily with meals & bedtime     04/08/19 1603    ondansetron (ZOFRAN) 4 MG tablet  Every 6 hours PRN     04/08/19 1603    HYDROcodone-acetaminophen (NORCO/VICODIN) 5-325 MG tablet  Every 6 hours PRN     04/08/19 1603    omeprazole (PRILOSEC) 40 MG capsule  Daily     04/08/19 1603           Emi Holes, PA-C 04/08/19 1608    Blane Ohara, MD 04/09/19 0930

## 2019-04-08 NOTE — ED Triage Notes (Signed)
Pt is having mid abdominal pain that started a week ago. Is now having pain in right flank pain as well. States she has been having increased urination. Also has swelling in her abdomen. Has had nausea and vomiting. Started 1 week ago. Denies diarrhea.

## 2019-04-08 NOTE — Discharge Instructions (Addendum)
Begin taking omeprazole daily.  Take Zofran every 6 hours as needed for nausea or vomiting.  Take Carafate 4 times daily prior to eating and prior to bedtime.  For severe, breakthrough pain, take 1 Norco every 6 hours.  Do not drive or operate machinery while taking this medication.  Try to change your diet to avoid foods that worsen symptoms of reflux or ulcer.  See below.  Please follow-up establish care with a primary care provider by calling the number outlined and circled in black.  Please follow-up with gastroenterology as well.  Please return to emergency department if you develop any new or worsening symptoms.  Do not drink alcohol, drive, operate machinery or participate in any other potentially dangerous activities while taking opiate pain medication as it may make you sleepy. Do not take this medication with any other sedating medications, either prescription or over-the-counter. If you were prescribed Percocet or Vicodin, do not take these with acetaminophen (Tylenol) as it is already contained within these medications and overdose of Tylenol is dangerous.   This medication is an opiate (or narcotic) pain medication and can be habit forming.  Use it as little as possible to achieve adequate pain control.  Do not use or use it with extreme caution if you have a history of opiate abuse or dependence. This medication is intended for your use only - do not give any to anyone else and keep it in a secure place where nobody else, especially children, have access to it. It will also cause or worsen constipation, so you may want to consider taking an over-the-counter stool softener while you are taking this medication.

## 2019-06-04 ENCOUNTER — Encounter (HOSPITAL_COMMUNITY): Payer: Self-pay | Admitting: Emergency Medicine

## 2019-06-04 ENCOUNTER — Emergency Department (HOSPITAL_COMMUNITY)
Admission: EM | Admit: 2019-06-04 | Discharge: 2019-06-04 | Disposition: A | Discharge status: Home / Self Care | Payer: Medicaid Other | Attending: Emergency Medicine | Admitting: Emergency Medicine

## 2019-06-04 ENCOUNTER — Emergency Department (HOSPITAL_COMMUNITY): Payer: Medicaid Other

## 2019-06-04 ENCOUNTER — Other Ambulatory Visit: Payer: Self-pay

## 2019-06-04 DIAGNOSIS — R112 Nausea with vomiting, unspecified: Secondary | ICD-10-CM

## 2019-06-04 DIAGNOSIS — R1011 Right upper quadrant pain: Secondary | ICD-10-CM | POA: Insufficient documentation

## 2019-06-04 DIAGNOSIS — R1010 Upper abdominal pain, unspecified: Secondary | ICD-10-CM

## 2019-06-04 DIAGNOSIS — K5289 Other specified noninfective gastroenteritis and colitis: Secondary | ICD-10-CM | POA: Insufficient documentation

## 2019-06-04 DIAGNOSIS — Z79899 Other long term (current) drug therapy: Secondary | ICD-10-CM | POA: Diagnosis not present

## 2019-06-04 DIAGNOSIS — F17210 Nicotine dependence, cigarettes, uncomplicated: Secondary | ICD-10-CM | POA: Diagnosis not present

## 2019-06-04 DIAGNOSIS — K529 Noninfective gastroenteritis and colitis, unspecified: Secondary | ICD-10-CM

## 2019-06-04 DIAGNOSIS — R197 Diarrhea, unspecified: Secondary | ICD-10-CM

## 2019-06-04 LAB — URINALYSIS, ROUTINE W REFLEX MICROSCOPIC
Bilirubin Urine: NEGATIVE
Glucose, UA: NEGATIVE mg/dL
Ketones, ur: NEGATIVE mg/dL
Nitrite: NEGATIVE
Protein, ur: NEGATIVE mg/dL
Specific Gravity, Urine: 1.031 — ABNORMAL HIGH (ref 1.005–1.030)
pH: 5 (ref 5.0–8.0)

## 2019-06-04 LAB — COMPREHENSIVE METABOLIC PANEL
ALT: 20 U/L (ref 0–44)
AST: 21 U/L (ref 15–41)
Albumin: 4.2 g/dL (ref 3.5–5.0)
Alkaline Phosphatase: 44 U/L (ref 38–126)
Anion gap: 12 (ref 5–15)
BUN: 7 mg/dL (ref 6–20)
CO2: 22 mmol/L (ref 22–32)
Calcium: 9.1 mg/dL (ref 8.9–10.3)
Chloride: 104 mmol/L (ref 98–111)
Creatinine, Ser: 0.47 mg/dL (ref 0.44–1.00)
GFR calc Af Amer: >60 mL/min (ref >60)
GFR calc non Af Amer: >60 mL/min (ref >60)
Glucose, Bld: 94 mg/dL (ref 70–99)
Potassium: 3.3 mmol/L — ABNORMAL LOW (ref 3.5–5.1)
Sodium: 138 mmol/L (ref 135–145)
Total Bilirubin: 0.2 mg/dL — ABNORMAL LOW (ref 0.3–1.2)
Total Protein: 7.1 g/dL (ref 6.5–8.1)

## 2019-06-04 LAB — LIPASE, BLOOD: Lipase: 32 U/L (ref 11–51)

## 2019-06-04 LAB — CBC
HCT: 37.4 % (ref 36.0–46.0)
Hemoglobin: 12.1 g/dL (ref 12.0–15.0)
MCH: 30.5 pg (ref 26.0–34.0)
MCHC: 32.4 g/dL (ref 30.0–36.0)
MCV: 94.2 fL (ref 80.0–100.0)
Platelets: 437 10*3/uL — ABNORMAL HIGH (ref 150–400)
RBC: 3.97 MIL/uL (ref 3.87–5.11)
RDW: 13.2 % (ref 11.5–15.5)
WBC: 5.8 10*3/uL (ref 4.0–10.5)
nRBC: 0 % (ref 0.0–0.2)

## 2019-06-04 LAB — POC URINE PREG, ED: Preg Test, Ur: NEGATIVE

## 2019-06-04 MED ORDER — PANTOPRAZOLE SODIUM 20 MG PO TBEC: 20.0000 mg | DELAYED_RELEASE_TABLET | Freq: Every day | ORAL | 1 refills | Status: DC

## 2019-06-04 MED ORDER — SODIUM CHLORIDE 0.9% FLUSH
3.0000 mL | Freq: Once | INTRAVENOUS | Status: AC
  Administered 2019-06-04: 3 mL via INTRAVENOUS

## 2019-06-04 MED ORDER — SODIUM CHLORIDE 0.9 % IV SOLN
INTRAVENOUS | Status: DC
  Administered 2019-06-04: 16:00:00 via INTRAVENOUS

## 2019-06-04 MED ORDER — PANTOPRAZOLE SODIUM 40 MG IV SOLR
40.0000 mg | Freq: Once | INTRAVENOUS | Status: AC
  Administered 2019-06-04: 40 mg via INTRAVENOUS
  Filled 2019-06-04: qty 40

## 2019-06-04 MED ORDER — ONDANSETRON HCL 4 MG/2ML IJ SOLN
4.0000 mg | Freq: Once | INTRAMUSCULAR | Status: AC
  Administered 2019-06-04: 4 mg via INTRAVENOUS
  Filled 2019-06-04: qty 2

## 2019-06-04 MED ORDER — ONDANSETRON HCL 8 MG PO TABS: 8.0000 mg | ORAL_TABLET | Freq: Three times a day (TID) | ORAL | 0 refills | Status: DC | PRN

## 2019-06-04 MED ORDER — SODIUM CHLORIDE 0.9 % IV BOLUS
1000.0000 mL | Freq: Once | INTRAVENOUS | Status: AC
  Administered 2019-06-04: 1000 mL via INTRAVENOUS

## 2019-06-04 MED ORDER — TRAMADOL HCL 50 MG PO TABS: 50.0000 mg | ORAL_TABLET | Freq: Four times a day (QID) | ORAL | 0 refills | Status: DC | PRN

## 2019-06-04 MED ORDER — IOHEXOL 300 MG/ML  SOLN
100.0000 mL | Freq: Once | INTRAMUSCULAR | Status: AC | PRN
  Administered 2019-06-04: 100 mL via INTRAVENOUS

## 2019-06-04 NOTE — Discharge Instructions (Signed)
Start with a clear liquid diet, then gradually advance to regular foods.  You can use the prescriptions given, as well as Tylenol and Tums if needed for additional control of symptoms.  The testing today did not show any serious problems.  However, since you are having similar problems multiple times it is a good idea to see the GI doctor for further evaluation and treatment.  You can was return here if needed for problems.

## 2019-06-04 NOTE — ED Triage Notes (Signed)
Patient reports abdominal discomfort with n/v/d that started yesterday. C/O body aches.

## 2019-06-04 NOTE — ED Notes (Signed)
Pt to CT  While administering med and NS, computer down  Pt going out door and RN noted computer had gone down to be rebooted

## 2019-06-04 NOTE — ED Provider Notes (Addendum)
[facility] Provider Note   CSN: [id number] Arrival date & time: 06/04/19  1335    History   Chief Complaint Chief Complaint  Patient presents with  . Abdominal Pain    HPI Ashley FrederickMonika David is a 33 y.o. female.     Patient with the acute onset right upper quadrant and epigastric abdominal pain at 6 in the morning yesterday.  Resulting in about 5 episodes of diarrhea without any blood.  And greater than 10 episodes of vomiting.  No blood.  Patient feeling tired and dizzy.  Patient's had work-up for epigastric abdominal pain in the past that has been negative.  X-ray had CT scan on May 7 however patient states this pain feels different.  Made in the past.  In the past they did suggest that it may be peptic ulcer disease.  Patient has not followed up with GI medicine.  Patient's had chills but no fevers.  Denies any upper respiratory symptoms.     Past Medical History:  Diagnosis Date  . Anemia   . Anxiety   . Depression   . Renal disorder    kidney stones    There are no active problems to display for this patient.   Past Surgical History:  Procedure Laterality Date  . LITHOTRIPSY       OB History    Gravida  2   Para  2   Term  2   Preterm      AB      Living  2     SAB      TAB      Ectopic      Multiple      Live Births  2            Home Medications    Prior to Admission medications   Medication Sig Start Date End Date Taking? Authorizing Provider  acetaminophen (TYLENOL) 500 MG tablet Take 1,000 mg by mouth every 6 (six) hours as needed for mild pain.   Yes [provider]  omeprazole (PRILOSEC) 40 MG capsule Take 1 capsule (40 mg total) by mouth daily. 04/08/19  Yes Law, Waylan BogaAlexandra M, PA-C  HYDROcodone-acetaminophen (NORCO/VICODIN) 5-325 MG tablet Take 1 tablet by mouth every 6 (six) hours as needed for severe pain. Patient not taking: Reported on 06/04/2019 04/08/19   Emi HolesLaw, Alexandra M, PA-C  medroxyPROGESTERone  (DEPO-PROVERA) 150 MG/ML injection Inject 1 mL (150 mg total) into the muscle every 3 (three) months. Patient not taking: Reported on 04/08/2019 09/04/18   Cheral MarkerBooker, Kimberly R, CNM  ondansetron (ZOFRAN) 4 MG tablet Take 1 tablet (4 mg total) by mouth every 6 (six) hours as needed for nausea or vomiting. Patient not taking: Reported on 06/04/2019 04/08/19   Emi HolesLaw, Alexandra M, PA-C  sucralfate (CARAFATE) 1 GM/10ML suspension Take 10 mLs (1 g total) by mouth 4 (four) times daily -  with meals and at bedtime. Patient not taking: Reported on 06/04/2019 04/08/19   Emi HolesLaw, Alexandra M, PA-C    Family History Family History  Problem Relation Age of Onset  . Congestive Heart Failure Maternal Grandmother   . Congestive Heart Failure Maternal Grandfather   . Diabetes Brother     Social History Social History   Tobacco Use  . Smoking status: Current Every Day Smoker    Packs/day: 1.00    Years: 12.00    Pack years: 12.00    Types: Cigarettes  . Smokeless tobacco: Never Used  Substance Use Topics  .  Alcohol use: Not Currently  . Drug use: Never     Allergies   Amoxicillin, Penicillins, and Tequin [gatifloxacin]   Review of Systems Review of Systems  Constitutional: Positive for chills. Negative for fever.  HENT: Negative for rhinorrhea and sore throat.   Eyes: Negative for visual disturbance.  Respiratory: Negative for cough and shortness of breath.   Cardiovascular: Negative for chest pain and leg swelling.  Gastrointestinal: Positive for abdominal pain, diarrhea, nausea and vomiting.  Genitourinary: Negative for dysuria.  Musculoskeletal: Negative for back pain and neck pain.  Skin: Negative for rash.  Neurological: Negative for dizziness, light-headedness and headaches.  Hematological: Does not bruise/bleed easily.  Psychiatric/Behavioral: Negative for confusion.     Physical Exam Updated Vital Signs BP 104/67   Pulse 76   Temp 98.5 F (36.9 C) (Oral)   Resp 20   Ht 1.549 m (5\' 1" )    Wt 61.8 kg   LMP 05/11/2019   SpO2 97%   BMI 25.74 kg/m   Physical Exam Vitals signs and nursing note reviewed.  Constitutional:      General: She is not in acute distress.    Appearance: She is well-developed.  HENT:     Head: Normocephalic and atraumatic.  Eyes:     Extraocular Movements: Extraocular movements intact.     Conjunctiva/sclera: Conjunctivae normal.  Neck:     Musculoskeletal: Normal range of motion and neck supple.  Cardiovascular:     Rate and Rhythm: Normal rate and regular rhythm.     Heart sounds: No murmur.  Pulmonary:     Effort: Pulmonary effort is normal. No respiratory distress.     Breath sounds: Normal breath sounds.  Abdominal:     General: Bowel sounds are normal.     Palpations: Abdomen is soft.     Tenderness: There is no abdominal tenderness.  Musculoskeletal: Normal range of motion.  Skin:    General: Skin is warm and dry.     Capillary Refill: Capillary refill takes less than 2 seconds.  Neurological:     General: No focal deficit present.     Mental Status: She is alert and oriented to person, place, and time.      ED Treatments / Results  Labs (all labs ordered are listed, but only abnormal results are displayed) Labs Reviewed  COMPREHENSIVE METABOLIC PANEL - Abnormal; Notable for the following components:      Result Value   Potassium 3.3 (*)    Total Bilirubin 0.2 (*)    All other components within normal limits  CBC - Abnormal; Notable for the following components:   Platelets 437 (*)    All other components within normal limits  URINALYSIS, ROUTINE W REFLEX MICROSCOPIC - Abnormal; Notable for the following components:   Color, Urine AMBER (*)    APPearance CLOUDY (*)    Specific Gravity, Urine 1.031 (*)    Hgb urine dipstick SMALL (*)    Leukocytes,Ua SMALL (*)    Bacteria, UA RARE (*)    All other components within normal limits  LIPASE, BLOOD  POC URINE PREG, ED    EKG None  Radiology Dg Chest Port 1 View   Result Date: 06/04/2019 CLINICAL DATA:  Abdominal discomfort. EXAM: PORTABLE CHEST 1 VIEW COMPARISON:  None. FINDINGS: Normal cardiac and mediastinal contours. No consolidative pulmonary opacities. No pleural effusion or pneumothorax. IMPRESSION: No acute cardiopulmonary process. Electronically Signed   By: Lovey Newcomer M.D.   On: 06/04/2019 14:41  Procedures Procedures (including critical care time)  Medications Ordered in ED Medications  0.9 %  sodium chloride infusion (has no administration in time range)  sodium chloride 0.9 % bolus 1,000 mL (has no administration in time range)  ondansetron (ZOFRAN) injection 4 mg (has no administration in time range)  pantoprazole (PROTONIX) injection 40 mg (has no administration in time range)  sodium chloride flush (NS) 0.9 % injection 3 mL (3 mLs Intravenous Given 06/04/19 1417)     Initial Impression / Assessment and Plan / ED Course  I have reviewed the triage vital signs and the nursing notes.  Pertinent labs & imaging results that were available during my care of the patient were reviewed by me and considered in my medical decision making (see chart for details).  Clinical Course as of Jun 11 1628  Fri Jun 04, 2019  1712 Normal  POC urine preg, ED [EW]  1713 Normal except potassium low  Comprehensive metabolic panel(!) [EW]  1713 Normal except platelets elevated  CBC(!) [EW]  1713 Normal except specific gravity high, hemoglobin present, leukocytes present, 6-10 white cells, and bacteria.  There are also elevated squamous epithelial, raising the possibility of contamination.  Urinalysis, Routine w reflex microscopic(!) [EW]  1714 No infiltrate or CHF, image reviewed by me  [facility] [EW]  1714 No acute abnormality, images reviewed by me  CT Abdomen Pelvis W Contrast [EW]    Clinical Course User Index [EW] Mancel BaleWentz, Elliott, MD       Further work-up of the right upper quadrant abdominal pain and the persistent vomiting and  diarrhea will do CT scan of the abdomen.  Disposition will be based on this.  Patient's labs without any acute findings.  Historically patient had pain in this area before with negative work-ups.  Patient has not followed up with GI medicine.  Probably will require follow-up with GI medicine if today's work-up is negative again.  We will give patient work note to be out of work for 2 days.  Urinalysis negative pregnancy test negative.  Final Clinical Impressions(s) / ED Diagnoses   Final diagnoses:  Pain of upper abdomen  Gastroenteritis    ED Discharge Orders    None       Vanetta MuldersZackowski, Heide Brossart, MD 06/04/19 1505    Vanetta MuldersZackowski, Cochise Dinneen, MD 06/12/19 [phone]

## 2019-06-04 NOTE — ED Provider Notes (Signed)
5:10 PM-checkout from Dr. Rogene Houston to evaluate CT imaging, may consider discharge.  Patient presented for evaluation of right upper quadrant abdominal pain with epigastric discomfort.  She has had multiple episodes of diarrhea.  She had previous upper abdominal pain but by her report this pain is different today.  Clinical Course as of Jun 03 1718  Fri Jun 04, 2019  1712 Normal  POC urine preg, ED [EW]  1713 Normal except potassium low  Comprehensive metabolic panel(!) [EW]  9485 Normal except platelets elevated  CBC(!) [EW]  1713 Normal except specific gravity high, hemoglobin present, leukocytes present, 6-10 white cells, and bacteria.  There are also elevated squamous epithelial, raising the possibility of contamination.  Urinalysis, Routine w reflex microscopic(!) [EW]  1714 No infiltrate or CHF, image reviewed by me  DG Chest Port 1 View [EW]  4627 No acute abnormality, images reviewed by me  CT Abdomen Pelvis W Contrast [EW]    Clinical Course User Index [EW] Daleen Bo, MD     Patient Vitals for the past 24 hrs:  BP Temp Temp src Pulse Resp SpO2 Height Weight  06/04/19 1630 (!) 96/50 - - 79 - 99 % - -  06/04/19 1600 107/73 - - 75 - 100 % - -  06/04/19 1530 96/66 - - (!) 45 - 97 % - -  06/04/19 1500 102/65 - - (!) 57 - 97 % - -  06/04/19 1430 104/67 - - 76 - 97 % - -  06/04/19 1343 - - - - - - - 61.8 kg  06/04/19 1341 117/71 98.5 F (36.9 C) Oral (!) 101 20 100 % - -  06/04/19 1340 - - - - - - 5\' 1"  (1.549 m) -    5:30 PM Reevaluation with update and discussion. After initial assessment and treatment, an updated evaluation reveals patient complains of generalized soreness with upper abdominal pain, at this time.  I discussed the findings with her.  She states that she has been taking Tylenol, without relief and Tums without relief.  Previously she has been treated with a short course of PPI and she would like to go back on a PPI at this time.  I had a long discussion  with her about various treatments for intra-abdominal processes including peptic ulcer disease, reflux, and nonspecific intestinal disorders.  Since she has had these problems previously, she will be referred to GI.Marland Kitchen [place] Decision Making: Specific recurrent abdominal pain now with vomiting and diarrhea.  Differential diagnosis includes food poisoning, viral enteritis, and nonspecific intestinal disorder.  There is no sign of significant bacterial infection, metabolic instability or suggestion for impending vascular collapse.  There is no indication for further ED treatment or evaluation.  CRITICAL CARE-no Performed by: Daleen Bo   Nursing Notes Reviewed/ Care Coordinated Applicable Imaging Reviewed Interpretation of Laboratory Data incorporated into ED treatment  The patient appears reasonably screened and/or stabilized for discharge and I doubt any other medical condition or other [facility] requiring further screening, evaluation, or treatment in the ED at this time prior to discharge.  Plan: Home Medications-prescriptions sent to pharmacy for Ultram, Zofran, Protonix.  Recommended OTC Tylenol.; Home Treatments-recommend clear liquid diet gradually advance, to bland foods; return here if the recommended treatment, does not improve the symptoms; Recommended follow up-GI follow-up as needed.      Daleen Bo, MD 06/04/19 [phone]

## 2020-03-25 ENCOUNTER — Emergency Department (HOSPITAL_COMMUNITY): Payer: Medicaid Other

## 2020-03-25 ENCOUNTER — Emergency Department (HOSPITAL_COMMUNITY)
Admission: EM | Admit: 2020-03-25 | Discharge: 2020-03-25 | Disposition: A | Discharge status: Home / Self Care | Payer: Medicaid Other | Attending: Emergency Medicine | Admitting: Emergency Medicine

## 2020-03-25 ENCOUNTER — Other Ambulatory Visit: Payer: Self-pay

## 2020-03-25 ENCOUNTER — Encounter (HOSPITAL_COMMUNITY): Payer: Self-pay | Admitting: Emergency Medicine

## 2020-03-25 DIAGNOSIS — Z79899 Other long term (current) drug therapy: Secondary | ICD-10-CM | POA: Insufficient documentation

## 2020-03-25 DIAGNOSIS — R1084 Generalized abdominal pain: Secondary | ICD-10-CM | POA: Diagnosis not present

## 2020-03-25 DIAGNOSIS — F17210 Nicotine dependence, cigarettes, uncomplicated: Secondary | ICD-10-CM | POA: Insufficient documentation

## 2020-03-25 DIAGNOSIS — K5900 Constipation, unspecified: Secondary | ICD-10-CM | POA: Diagnosis not present

## 2020-03-25 DIAGNOSIS — R1013 Epigastric pain: Secondary | ICD-10-CM | POA: Diagnosis present

## 2020-03-25 LAB — URINALYSIS, ROUTINE W REFLEX MICROSCOPIC
Bilirubin Urine: NEGATIVE
Glucose, UA: NEGATIVE mg/dL
Ketones, ur: NEGATIVE mg/dL
Leukocytes,Ua: NEGATIVE
Nitrite: NEGATIVE
Protein, ur: NEGATIVE mg/dL
Specific Gravity, Urine: 1.002 — ABNORMAL LOW (ref 1.005–1.030)
pH: 7 (ref 5.0–8.0)

## 2020-03-25 LAB — CBC
HCT: 32.4 % — ABNORMAL LOW (ref 36.0–46.0)
Hemoglobin: 10.2 g/dL — ABNORMAL LOW (ref 12.0–15.0)
MCH: 29.3 pg (ref 26.0–34.0)
MCHC: 31.5 g/dL (ref 30.0–36.0)
MCV: 93.1 fL (ref 80.0–100.0)
Platelets: 377 10*3/uL (ref 150–400)
RBC: 3.48 MIL/uL — ABNORMAL LOW (ref 3.87–5.11)
RDW: 14.1 % (ref 11.5–15.5)
WBC: 6.9 10*3/uL (ref 4.0–10.5)
nRBC: 0 % (ref 0.0–0.2)

## 2020-03-25 LAB — COMPREHENSIVE METABOLIC PANEL
ALT: 16 U/L (ref 0–44)
AST: 17 U/L (ref 15–41)
Albumin: 4.3 g/dL (ref 3.5–5.0)
Alkaline Phosphatase: 57 U/L (ref 38–126)
Anion gap: 8 (ref 5–15)
BUN: 9 mg/dL (ref 6–20)
CO2: 26 mmol/L (ref 22–32)
Calcium: 9.2 mg/dL (ref 8.9–10.3)
Chloride: 102 mmol/L (ref 98–111)
Creatinine, Ser: 0.52 mg/dL (ref 0.44–1.00)
GFR calc Af Amer: >60 mL/min (ref >60)
GFR calc non Af Amer: >60 mL/min (ref >60)
Glucose, Bld: 105 mg/dL — ABNORMAL HIGH (ref 70–99)
Potassium: 4 mmol/L (ref 3.5–5.1)
Sodium: 136 mmol/L (ref 135–145)
Total Bilirubin: 0.3 mg/dL (ref 0.3–1.2)
Total Protein: 7.4 g/dL (ref 6.5–8.1)

## 2020-03-25 LAB — LIPASE, BLOOD: Lipase: 31 U/L (ref 11–51)

## 2020-03-25 LAB — PREGNANCY, URINE: Preg Test, Ur: NEGATIVE

## 2020-03-25 MED ORDER — KETOROLAC TROMETHAMINE 60 MG/2ML IM SOLN
60.0000 mg | Freq: Once | INTRAMUSCULAR | Status: AC
  Administered 2020-03-25: 60 mg via INTRAMUSCULAR
  Filled 2020-03-25: qty 2

## 2020-03-25 MED ORDER — POLYETHYLENE GLYCOL 3350 17 G PO PACK: 17.0000 g | PACK | Freq: Every day | ORAL | 0 refills | Status: DC

## 2020-03-25 MED ORDER — IOHEXOL 300 MG/ML  SOLN
75.0000 mL | Freq: Once | INTRAMUSCULAR | Status: AC | PRN
  Administered 2020-03-25: 17:00:00 75 mL via INTRAVENOUS

## 2020-03-25 NOTE — ED Notes (Signed)
In CT

## 2020-03-25 NOTE — ED Notes (Signed)
awaiting CT

## 2020-03-25 NOTE — Discharge Instructions (Signed)
Return if any problems.

## 2020-03-25 NOTE — ED Notes (Signed)
Here for eval of abd pain   Points to epigastric area as pain site

## 2020-03-25 NOTE — ED Triage Notes (Signed)
Pt c/o abdominal pain in the epigastric area for the past few days, denies fevers, n/v/d. Denies covid exposure.

## 2020-03-25 NOTE — ED Notes (Signed)
Awaiting CT

## 2020-03-26 NOTE — ED Provider Notes (Signed)
[facility] EMERGENCY DEPARTMENT Provider Note   CSN: [id number] Arrival date & time: 03/25/20  1235     History Chief Complaint  Patient presents with  . Abdominal Pain    Ashley David is a 34 y.o. female.  The history is provided by the patient. No language interpreter was used.  Abdominal Pain Pain location:  Epigastric Pain quality: aching   Pain radiates to:  Does not radiate Pain severity:  Moderate Onset quality:  Gradual Timing:  Constant Progression:  Worsening Chronicity:  New Context: alcohol use   Relieved by:  Nothing Worsened by:  Nothing Ineffective treatments:  None tried Associated symptoms: constipation   Associated symptoms: no nausea and no vomiting   Risk factors: no alcohol abuse        Past Medical History:  Diagnosis Date  . Anemia   . Anxiety   . Depression   . Renal disorder    kidney stones    There are no problems to display for this patient.   Past Surgical History:  Procedure Laterality Date  . LITHOTRIPSY       OB History    Gravida  2   Para  2   Term  2   Preterm      AB      Living  2     SAB      TAB      Ectopic      Multiple      Live Births  2           Family History  Problem Relation Age of Onset  . Congestive Heart Failure Maternal Grandmother   . Congestive Heart Failure Maternal Grandfather   . Diabetes Brother     Social History   Tobacco Use  . Smoking status: Current Every Day Smoker    Packs/day: 1.00    Years: 12.00    Pack years: 12.00    Types: Cigarettes  . Smokeless tobacco: Never Used  Substance Use Topics  . Alcohol use: Not Currently  . Drug use: Never    Home Medications Prior to Admission medications   Medication Sig Start Date End Date Taking? Authorizing Provider  acetaminophen (TYLENOL) 500 MG tablet Take 1,000 mg by mouth every 6 (six) hours as needed for mild pain.    [provider]  HYDROcodone-acetaminophen (NORCO/VICODIN) 5-325 MG  tablet Take 1 tablet by mouth every 6 (six) hours as needed for severe pain. Patient not taking: Reported on 06/04/2019 04/08/19   Emi Holes, PA-C  medroxyPROGESTERone (DEPO-PROVERA) 150 MG/ML injection Inject 1 mL (150 mg total) into the muscle every 3 (three) months. Patient not taking: Reported on 04/08/2019 09/04/18   Cheral Marker, CNM  omeprazole (PRILOSEC) 40 MG capsule Take 1 capsule (40 mg total) by mouth daily. 04/08/19   Law, Waylan Boga, PA-C  ondansetron (ZOFRAN) 8 MG tablet Take 1 tablet (8 mg total) by mouth every 8 (eight) hours as needed for nausea or vomiting. 06/04/19   Mancel Bale, MD  pantoprazole (PROTONIX) 20 MG tablet Take 1 tablet (20 mg total) by mouth daily. 06/04/19   Mancel Bale, MD  polyethylene glycol (MIRALAX MIX-IN PAX) 17 g packet Take 17 g by mouth daily. 03/25/20   Elson Areas, PA-C  sucralfate (CARAFATE) 1 GM/10ML suspension Take 10 mLs (1 g total) by mouth 4 (four) times daily -  with meals and at bedtime. Patient not taking: Reported on 06/04/2019 04/08/19   Buel Ream  M, PA-C  traMADol (ULTRAM) 50 MG tablet Take 1 tablet (50 mg total) by mouth every 6 (six) hours as needed for moderate pain. 06/04/19   Mancel Bale, MD    Allergies    Amoxicillin, Penicillins, and Tequin [gatifloxacin]  Review of Systems   Review of Systems  Gastrointestinal: Positive for abdominal pain and constipation. Negative for nausea and vomiting.  All other systems reviewed and are negative.   Physical Exam Updated Vital Signs BP (!) 118/57 (BP Location: Right Arm)   Pulse 79   Temp 98.1 F (36.7 C) (Temporal)   Resp 18   Ht 5\' 1"  (1.549 m)   Wt 60.8 kg   LMP 03/15/2020 (Exact Date)   SpO2 100%   BMI 25.32 kg/m   Physical Exam Vitals reviewed.  HENT:     Head: Normocephalic.  Eyes:     Extraocular Movements: Extraocular movements intact.  Cardiovascular:     Rate and Rhythm: Normal rate.  Pulmonary:     Effort: Pulmonary effort is normal.    Abdominal:     General: Abdomen is flat. Bowel sounds are normal.     Tenderness: There is generalized abdominal tenderness and tenderness in the epigastric area.  Skin:    General: Skin is warm.  Neurological:     General: No focal deficit present.     Mental Status: She is alert.     ED Results / Procedures / Treatments   Labs (all labs ordered are listed, but only abnormal results are displayed) Labs Reviewed  COMPREHENSIVE METABOLIC PANEL - Abnormal; Notable for the following components:      Result Value   Glucose, Bld 105 (*)    All other components within normal limits  CBC - Abnormal; Notable for the following components:   RBC 3.48 (*)    Hemoglobin 10.2 (*)    HCT 32.4 (*)    All other components within normal limits  URINALYSIS, ROUTINE W REFLEX MICROSCOPIC - Abnormal; Notable for the following components:   Color, Urine STRAW (*)    Specific Gravity, Urine 1.002 (*)    Hgb urine dipstick SMALL (*)    Bacteria, UA RARE (*)    All other components within normal limits  LIPASE, BLOOD  PREGNANCY, URINE    EKG None  Radiology CT ABDOMEN PELVIS W CONTRAST  Result Date: 03/25/2020 CLINICAL DATA:  34 year old female with abdominal pain. EXAM: CT ABDOMEN AND PELVIS WITH CONTRAST TECHNIQUE: Multidetector CT imaging of the abdomen and pelvis was performed using the standard protocol following bolus administration of intravenous contrast. CONTRAST:  90mL OMNIPAQUE IOHEXOL 300 MG/ML  SOLN COMPARISON:  CT abdomen pelvis dated 06/04/2019. FINDINGS: Lower chest: Minimal bibasilar dependent atelectasis. The visualized lung bases are otherwise clear. No intra-abdominal free air or free fluid. Hepatobiliary: No focal liver abnormality is seen. No gallstones, gallbladder wall thickening, or biliary dilatation. Pancreas: Unremarkable. No pancreatic ductal dilatation or surrounding inflammatory changes. Spleen: Normal in size without focal abnormality. Adrenals/Urinary Tract: Adrenal  glands are unremarkable. Kidneys are normal, without renal calculi, focal lesion, or hydronephrosis. Bladder is unremarkable. Stomach/Bowel: There is moderate stool throughout the colon. There is no bowel obstruction or active inflammation. The appendix is normal. Vascular/Lymphatic: The abdominal aorta and IVC are unremarkable. No portal venous gas. There is no adenopathy. Reproductive: The uterus is anteverted and appears unremarkable. No adnexal masses. Other: None Musculoskeletal: No acute or significant osseous findings. IMPRESSION: 1. No acute intra-abdominal or pelvic pathology. 2. Moderate colonic stool burden. No  bowel obstruction. Normal appendix. Electronically Signed   By: Anner Crete M.D.   On: 03/25/2020 17:40    Procedures Procedures (including critical care time)  Medications Ordered in ED Medications  iohexol (OMNIPAQUE) 300 MG/ML solution 75 mL (75 mLs Intravenous Contrast Given 03/25/20 1721)  ketorolac (TORADOL) injection 60 mg (60 mg Intramuscular Given 03/25/20 1840)    ED Course  I have reviewed the triage vital signs and the nursing notes.  Pertinent labs & imaging results that were available during my care of the patient were reviewed by me and considered in my medical decision making (see chart for details).    MDM Rules/Calculators/A&P                     MDM Number of Diagnoses or Management Options Constipation, unspecified constipation type: new, needed workup Generalized abdominal pain: new, needed workup   Amount and/or Complexity of Data Reviewed Clinical lab tests: ordered and reviewed Tests in the radiology section of CPT: ordered Tests in the medicine section of CPT: ordered and reviewed Obtain history from someone other than the patient: no Review and summarize past medical records: yes Discuss the patient with other providers: no Independent visualization of images, tracings, or specimens: yes  Risk of Complications, Morbidity, and/or  Mortality Presenting problems: moderate Diagnostic procedures: moderate Management options: low   Final Clinical Impression(s) / ED Diagnoses Final diagnoses:  Generalized abdominal pain  Constipation, unspecified constipation type    Rx / DC Orders ED Discharge Orders         Ordered    polyethylene glycol (MIRALAX MIX-IN PAX) 17 g packet  Daily     03/25/20 1828        An After Visit Summary was printed and given to the patient.    Sidney Ace 03/26/20 1233    Dorie Rank, MD 03/27/20 [phone]

## 2020-12-02 NOTE — L&D Delivery Note (Addendum)
OB/GYN Faculty Practice Delivery Note  Ashley David is a 35 y.o. [id number] s/p VD at [redacted]w[redacted]d. She was admitted for IOL for pre-E w/ SF (HA, vision change criteria).   ROM: 13h 66m with Clear fluid GBS Status: --Theda Sers (11/08 1419) Maximum Maternal Temperature: 98.3  Labor Progress: Patient presented to L&D for IOL for pre-E w/ SF (HA, vision change criteria). Initial SVE: 4/50/ballotable. She then progressed to complete.   Delivery Date/Time: 10/11/21 @1339  Delivery: Called to room and patient was complete and pushing. Head delivered OA. Nuchal cord present and delivered through. Shoulder and body delivered in usual fashion. Infant with spontaneous cry, placed on mother's abdomen, dried and stimulated. Cord clamped x 2 after 1-minute delay, and cut by father of baby. Cord blood drawn. Placenta delivered spontaneously with gentle cord traction. Fundus alternated between boggy and firm with massage and Pitocin. Hemabate given as well. Magnesium decreased to 1g/hr d/t pt lethargy. Labia, perineum, vagina, and cervix inspected with 1st degree perineal laceration and repaired with one continuous suture. Placenta was examined after and found to be intact with normal 3-vessel cord.   Placenta: Intact w/ 3VC Complications: Boggy uterus > Hemabate Lacerations: 1st degree perineal repaired with 3-0 Vicryl rapide EBL: Analgesia: Epidural and lidocaine Medications: decreased Mag to 1g/hr given Mag level returned prior to delivery at 5.8.   Postpartum Planning [X]  message to sent to schedule follow-up  [X]  vaccines UTD  Infant: APGARs 6/7  pending weight  Midwife attestation: I was gloved and present for delivery in its entirety and I agree with the above resident's note.  , CNM 3:20 PM   , DO 10/11/2021, 2:50 PM PGY-1, [facility]

## 2021-01-16 ENCOUNTER — Ambulatory Visit (INDEPENDENT_AMBULATORY_CARE_PROVIDER_SITE_OTHER): Payer: Medicaid Other | Admitting: Ophthalmology

## 2021-01-16 ENCOUNTER — Other Ambulatory Visit: Payer: Self-pay

## 2021-01-16 ENCOUNTER — Encounter (INDEPENDENT_AMBULATORY_CARE_PROVIDER_SITE_OTHER): Payer: Self-pay | Admitting: Ophthalmology

## 2021-01-16 DIAGNOSIS — H31003 Unspecified chorioretinal scars, bilateral: Secondary | ICD-10-CM

## 2021-01-16 DIAGNOSIS — H3581 Retinal edema: Secondary | ICD-10-CM

## 2021-01-16 NOTE — Progress Notes (Signed)
[facility] - Clinic Note  01/16/2021     CHIEF COMPLAINT Patient presents for Retina Evaluation   HISTORY OF PRESENT ILLNESS: Ashley David is a 35 y.o. female who presents to the clinic today for:   HPI    Retina Evaluation    In both eyes.  This started weeks ago.  Duration of weeks.  Context:  distance vision and near vision.  I, the attending physician,  performed the HPI with the patient and updated documentation appropriately.          Comments    Pt states vision is stable OU---wating on new glasses to come in.  Patient denies eye pain or discomfort.  Pt denies any new or worsening floaters or fol OU.        Last edited by Rennis ChrisZamora, Akeia Perot, MD on 01/16/2021 12:23 PM. (History)    pt is here on the referral of Dr. [name] for concern of retinal holes, pt states she went to see him for a routine eye exam bc her vision was becoming blurry, pt had not had an eye exam prior since she was 35 years old  Referring physician: Daisy Lazarotter, Mark, DO [address] [place],  [place] [id number]  HISTORICAL INFORMATION:   Selected notes from the MEDICAL RECORD NUMBER Referred by Dr. [name] for concern of retinal hole LEE:  Ocular Hx- PMH-    CURRENT MEDICATIONS: No current outpatient medications on file. (Ophthalmic Drugs)   No current facility-administered medications for this visit. (Ophthalmic Drugs)   Current Outpatient Medications (Other)  Medication Sig  . acetaminophen (TYLENOL) 500 MG tablet Take 1,000 mg by mouth every 6 (six) hours as needed for mild pain.  Marland Kitchen. HYDROcodone-acetaminophen (NORCO/VICODIN) 5-325 MG tablet Take 1 tablet by mouth every 6 (six) hours as needed for severe pain. (Patient not taking: Reported on 06/04/2019)  . medroxyPROGESTERone (DEPO-PROVERA) 150 MG/ML injection Inject 1 mL (150 mg total) into the muscle every 3 (three) months. (Patient not taking: Reported on 04/08/2019)  . omeprazole (PRILOSEC) 40 MG capsule Take 1 capsule (40  mg total) by mouth daily.  . ondansetron (ZOFRAN) 8 MG tablet Take 1 tablet (8 mg total) by mouth every 8 (eight) hours as needed for nausea or vomiting.  . pantoprazole (PROTONIX) 20 MG tablet Take 1 tablet (20 mg total) by mouth daily.  . polyethylene glycol (MIRALAX MIX-IN PAX) 17 g packet Take 17 g by mouth daily.  . sucralfate (CARAFATE) 1 GM/10ML suspension Take 10 mLs (1 g total) by mouth 4 (four) times daily -  with meals and at bedtime. (Patient not taking: Reported on 06/04/2019)  . traMADol (ULTRAM) 50 MG tablet Take 1 tablet (50 mg total) by mouth every 6 (six) hours as needed for moderate pain.   No current facility-administered medications for this visit. (Other)      REVIEW OF SYSTEMS: ROS    Positive for: Eyes   Negative for: Constitutional, Gastrointestinal, Neurological, Skin, Genitourinary, Musculoskeletal, HENT, Endocrine, Cardiovascular, Respiratory, Psychiatric, Allergic/Imm, Heme/Lymph   Last edited by [name], [name] on 01/16/2021  9:10 AM. (History)       ALLERGIES Allergies  Allergen Reactions  . Amoxicillin Anaphylaxis  . Penicillins Anaphylaxis    Did it involve swelling of the face/tongue/throat, SOB, or low BP? No Did it involve sudden or severe rash/hives, skin peeling, or any reaction on the inside of your mouth or nose? No Did you need to seek medical attention at a hospital or doctor's office?  No When did it last happen? If all above answers are "NO", may proceed with cephalosporin use.   [name] [Gatifloxacin] Anaphylaxis    PAST MEDICAL HISTORY Past Medical History:  Diagnosis Date  . Anemia   . Anxiety   . Depression   . Renal disorder    kidney stones   Past Surgical History:  Procedure Laterality Date  . LITHOTRIPSY      FAMILY HISTORY Family History  Problem Relation Age of Onset  . Congestive Heart Failure Maternal Grandmother   . Congestive Heart Failure Maternal Grandfather   . Diabetes Brother     SOCIAL  HISTORY Social History   Tobacco Use  . Smoking status: Current Every Day Smoker    Packs/day: 1.00    Years: 12.00    Pack years: 12.00    Types: Cigarettes  . Smokeless tobacco: Never Used  Vaping Use  . Vaping Use: Never used  Substance Use Topics  . Alcohol use: Not Currently  . Drug use: Never         OPHTHALMIC EXAM:  Base Eye Exam    Visual Acuity (Snellen - Linear)      Right Left   Dist [place] 20/30 -2 20/40 -2   Dist ph [place] 20/30 -1 20/30 -2       Tonometry (Tonopen, 9:07 AM)      Right Left   Pressure 20 19       Pupils      Dark Light Shape React APD   Right 3 2 Round Brisk 0   Left 3 2 Round Brisk 0       Visual Fields      Left Right    Full Full       Extraocular Movement      Right Left    Full Full       Neuro/Psych    Oriented x3: Yes   Mood/Affect: Normal       Dilation    Both eyes: 1.0% Mydriacyl, 2.5% Phenylephrine @ 9:07 AM        Slit Lamp and Fundus Exam    Slit Lamp Exam      Right Left   Lids/Lashes Normal Normal   Conjunctiva/Sclera White and quiet White and quiet   Cornea Clear Clear   Anterior Chamber Deep and quiet Deep and quiet   Iris Round and dilated Round and dilated   Lens Clear Clear   Vitreous clear clear       Fundus Exam      Right Left   Disc Pink and Sharp Pink and Sharp   C/D Ratio 0.2 0.2   Macula Flat, Good foveal reflex, No heme or edema Flat, Good foveal reflex, No heme or edema   Vessels Normal Normal   Periphery Attached, pigmented RPE mottling at 1030, No RT/RD Attached, focal pigment clump at 0400 equator; no RT/RD        Refraction    Manifest Refraction      Sphere Cylinder Axis Dist VA   Right -1.50 +0.25 060 20/20-2   Left -1.50 +1.00 110 20/20-2          IMAGING AND PROCEDURES  Imaging and Procedures for 01/16/2021  OCT, Retina - OU - Both Eyes       Right Eye Quality was good. Central Foveal Thickness: 248. Progression has no prior data. Findings include normal  foveal contour, no IRF, no SRF, vitreomacular adhesion .   Left Eye Quality  was good. Central Foveal Thickness: 247. Progression has no prior data. Findings include normal foveal contour, no IRF, no SRF, vitreomacular adhesion .   Notes *Images captured and stored on drive  Diagnosis / Impression:  NFP, no IRF/SRF OU  Clinical management:  See below  Abbreviations: NFP - Normal foveal profile. CME - cystoid macular edema. PED - pigment epithelial detachment. IRF - intraretinal fluid. SRF - subretinal fluid. EZ - ellipsoid zone. ERM - epiretinal membrane. ORA - outer retinal atrophy. ORT - outer retinal tubulation. SRHM - subretinal hyper-reflective material. IRHM - intraretinal hyper-reflective material                 ASSESSMENT/PLAN:    ICD-10-CM   1. 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 Retinal edema  H35.81 OCT, Retina - OU - Both Eyes    1. RPE mottling / CR scarring OU  - pt asymptomatic -- no flashes/floaters  - focal peripheral RPE changes OU on exam  - no RT/RD on meticulous inspection  - no intervention indicated or recommended at this time  - pt is cleared from a retina standpoint for release to Dr. Charise Killian and resumption of primary eye care  2. No retinal edema on exam or OCT    Ophthalmic Meds Ordered this visit:  No orders of the defined types were placed in this encounter.      Return if symptoms worsen or fail to improve.  There are no Patient Instructions on file for this visit.   Explained the diagnoses, plan, and follow up with the patient and they expressed understanding.  Patient expressed understanding of the importance of proper follow up care.   This document serves as a record of services personally performed by Karie Chimera, MD, PhD. It was created on their behalf by Glee Arvin. Manson Passey, OA an ophthalmic technician. The creation of this record is the provider's dictation and/or activities during the visit.    Electronically  signed by: Glee Arvin. Kristopher Oppenheim 02.15.2022 12:29 PM  Karie Chimera, M.D., Ph.D. Diseases & Surgery of the Retina and Vitreous [facility]  I have reviewed the above documentation for accuracy and completeness, and I agree with the above. Karie Chimera, M.D., Ph.D. 01/16/21 12:29 PM   Abbreviations: M myopia (nearsighted); A astigmatism; H hyperopia (farsighted); P presbyopia; Mrx spectacle prescription;  CTL contact lenses; OD right eye; OS left eye; OU both eyes  XT exotropia; ET esotropia; PEK punctate epithelial keratitis; PEE punctate epithelial erosions; DES dry eye syndrome; MGD meibomian gland dysfunction; ATs artificial tears; PFAT's preservative free artificial tears; NSC nuclear sclerotic cataract; PSC posterior subcapsular cataract; ERM epi-retinal membrane; PVD posterior vitreous detachment; RD retinal detachment; DM diabetes mellitus; DR diabetic retinopathy; NPDR non-proliferative diabetic retinopathy; PDR proliferative diabetic retinopathy; CSME clinically significant macular edema; DME diabetic macular edema; dbh dot blot hemorrhages; CWS cotton wool spot; POAG primary open angle glaucoma; C/D cup-to-disc ratio; HVF humphrey visual field; GVF goldmann visual field; OCT optical coherence tomography; IOP intraocular pressure; BRVO Branch retinal vein occlusion; CRVO central retinal vein occlusion; CRAO central retinal artery occlusion; BRAO branch retinal artery occlusion; RT retinal tear; SB scleral buckle; PPV pars plana vitrectomy; VH Vitreous hemorrhage; PRP panretinal laser photocoagulation; IVK intravitreal kenalog; VMT vitreomacular traction; MH Macular hole;  NVD neovascularization of the disc; NVE neovascularization elsewhere; AREDS age related eye disease study; ARMD age related macular degeneration; POAG primary open angle glaucoma; EBMD epithelial/anterior basement membrane dystrophy; ACIOL anterior  chamber intraocular lens; IOL intraocular lens; PCIOL  posterior chamber intraocular lens; Phaco/IOL phacoemulsification with intraocular lens placement; PRK photorefractive keratectomy; LASIK laser assisted in situ keratomileusis; HTN hypertension; DM diabetes mellitus; COPD chronic obstructive pulmonary disease

## 2021-01-23 ENCOUNTER — Encounter (INDEPENDENT_AMBULATORY_CARE_PROVIDER_SITE_OTHER): Payer: Medicaid Other | Admitting: Ophthalmology

## 2021-04-04 ENCOUNTER — Encounter: Payer: Self-pay | Admitting: *Deleted

## 2021-04-04 ENCOUNTER — Other Ambulatory Visit: Payer: Self-pay

## 2021-04-04 ENCOUNTER — Other Ambulatory Visit (INDEPENDENT_AMBULATORY_CARE_PROVIDER_SITE_OTHER): Payer: Medicaid Other | Admitting: *Deleted

## 2021-04-04 VITALS — BP 115/64 | HR 99 | Ht 61.0 in | Wt 129.4 lb

## 2021-04-04 DIAGNOSIS — Z3201 Encounter for pregnancy test, result positive: Secondary | ICD-10-CM

## 2021-04-04 MED ORDER — PROMETHAZINE HCL 25 MG PO TABS
25.0000 mg | ORAL_TABLET | Freq: Four times a day (QID) | ORAL | 1 refills | Status: DC | PRN
Start: 2021-04-04 — End: 2021-04-26

## 2021-04-04 NOTE — Progress Notes (Signed)
   NURSE VISIT- PREGNANCY CONFIRMATION   SUBJECTIVE:  Ashley David is a 35 y.o. [id number] female at [redacted]w[redacted]d by certain LMP of Patient's last menstrual period was 02/05/2021 (exact date). Here for pregnancy confirmation.  Home pregnancy test: positive x 2  She reports nausea and vomiting.  She is taking prenatal vitamins.    OBJECTIVE:  BP 115/64 (BP Location: Left Arm, Patient Position: Sitting, Cuff Size: Normal)   Pulse 99   Ht 5\' 1"  (1.549 m)   Wt 129 lb 6.4 oz (58.7 kg)   LMP 02/05/2021 (Exact Date)   BMI 24.45 kg/m   Appears well, in no apparent distress OB History  Gravida Para Term Preterm AB Living  3 2 2     2   SAB IAB Ectopic Multiple Live Births          2    # Outcome Date GA Lbr Len/2nd Weight Sex Delivery Anes PTL Lv  3 Current           2 Term     M Vag-Spont   LIV  1 Term     F Vag-Spont   LIV    No results found for this or any previous visit (from the past 24 hour(s)).  ASSESSMENT: Positive pregnancy test, [redacted]w[redacted]d by LMP    PLAN: Schedule for dating ultrasound in 2 weeks Prenatal vitamins: plans to begin OTC ASAP   Nausea medicines: requested-note routed to to send prescription   OB packet given: Yes  [redacted]w[redacted]d  04/04/2021 10:50 AM

## 2021-04-04 NOTE — Addendum Note (Signed)
Addended by: Cyril Mourning A on: 04/04/2021 10:54 AM   Modules accepted: Orders

## 2021-04-04 NOTE — Progress Notes (Signed)
Chart reviewed for nurse visit. Agree with plan of care. Will rx phenergan Adline Potter, NP 04/04/2021 10:54 AM

## 2021-04-23 ENCOUNTER — Other Ambulatory Visit: Payer: Self-pay | Admitting: Obstetrics & Gynecology

## 2021-04-23 DIAGNOSIS — O3680X0 Pregnancy with inconclusive fetal viability, not applicable or unspecified: Secondary | ICD-10-CM

## 2021-04-24 ENCOUNTER — Ambulatory Visit (INDEPENDENT_AMBULATORY_CARE_PROVIDER_SITE_OTHER): Payer: Medicaid Other

## 2021-04-24 ENCOUNTER — Other Ambulatory Visit: Payer: Self-pay | Admitting: Obstetrics & Gynecology

## 2021-04-24 ENCOUNTER — Other Ambulatory Visit: Payer: Self-pay

## 2021-04-24 ENCOUNTER — Other Ambulatory Visit: Payer: Medicaid Other

## 2021-04-24 DIAGNOSIS — Z3682 Encounter for antenatal screening for nuchal translucency: Secondary | ICD-10-CM | POA: Diagnosis not present

## 2021-04-24 DIAGNOSIS — O3680X0 Pregnancy with inconclusive fetal viability, not applicable or unspecified: Secondary | ICD-10-CM

## 2021-04-24 DIAGNOSIS — Z3480 Encounter for supervision of other normal pregnancy, unspecified trimester: Secondary | ICD-10-CM

## 2021-04-24 DIAGNOSIS — Z3A12 12 weeks gestation of pregnancy: Secondary | ICD-10-CM

## 2021-04-24 DIAGNOSIS — Z3490 Encounter for supervision of normal pregnancy, unspecified, unspecified trimester: Secondary | ICD-10-CM | POA: Insufficient documentation

## 2021-04-24 NOTE — Progress Notes (Signed)
[place] 12+4 wks,single IUP,FHR 160 bpm,NB present,NT 1.4 mm,CRL 60.20 mm,posterior placenta gr 0,normal ovaries

## 2021-04-25 ENCOUNTER — Other Ambulatory Visit: Payer: Self-pay | Admitting: Advanced Practice Midwife

## 2021-04-25 MED ORDER — FERROUS FUM-IRON POLYSACCH-FA 162-115.2-1 MG PO CAPS: 1.0000 | ORAL_CAPSULE | ORAL | 6 refills | Status: DC

## 2021-04-26 ENCOUNTER — Telehealth: Payer: Self-pay | Admitting: *Deleted

## 2021-04-26 ENCOUNTER — Other Ambulatory Visit: Payer: Self-pay | Admitting: Women's Health

## 2021-04-26 LAB — INTEGRATED 1
Crown Rump Length: 60.2 mm
Gest. Age on Collection Date: 12.3 weeks
Maternal Age at EDD: 35.4 yr
Nuchal Translucency (NT): 1.4 mm
Number of Fetuses: 1
PAPP-A Value: 779 ng/mL
Weight: 137 [lb_av]

## 2021-04-26 LAB — CBC/D/PLT+RPR+RH+ABO+RUBIGG...
Antibody Screen: NEGATIVE
Basophils Absolute: 0 10*3/uL (ref 0.0–0.2)
Basos: 0 %
EOS (ABSOLUTE): 0.2 10*3/uL (ref 0.0–0.4)
Eos: 2 %
HCV Ab: <0.1 s/co ratio (ref 0.0–0.9)
HIV Screen 4th Generation wRfx: NONREACTIVE
Hematocrit: 30.5 % — ABNORMAL LOW (ref 34.0–46.6)
Hemoglobin: 10.1 g/dL — ABNORMAL LOW (ref 11.1–15.9)
Hepatitis B Surface Ag: NEGATIVE
Immature Grans (Abs): 0.1 10*3/uL (ref 0.0–0.1)
Immature Granulocytes: 1 %
Lymphocytes Absolute: 2.1 10*3/uL (ref 0.7–3.1)
Lymphs: 22 %
MCH: 29.2 pg (ref 26.6–33.0)
MCHC: 33.1 g/dL (ref 31.5–35.7)
MCV: 88 fL (ref 79–97)
Monocytes Absolute: 0.7 10*3/uL (ref 0.1–0.9)
Monocytes: 7 %
Neutrophils Absolute: 6.4 10*3/uL (ref 1.4–7.0)
Neutrophils: 68 %
Platelets: 398 10*3/uL (ref 150–450)
RBC: 3.46 x10E6/uL — ABNORMAL LOW (ref 3.77–5.28)
RDW: 15.5 % — ABNORMAL HIGH (ref 11.7–15.4)
RPR Ser Ql: NONREACTIVE
Rh Factor: POSITIVE
Rubella Antibodies, IGG: 1.52 index (ref >0.99)
WBC: 9.4 10*3/uL (ref 3.4–10.8)

## 2021-04-26 LAB — HCV INTERPRETATION

## 2021-04-26 MED ORDER — PROMETHAZINE HCL 25 MG PO TABS: 25.0000 mg | ORAL_TABLET | Freq: Four times a day (QID) | ORAL | 6 refills | Status: DC | PRN

## 2021-04-26 NOTE — Telephone Encounter (Signed)
Patient informed she has anemia and iron supplements sent to her pharmacy: best taken with orange juice. Taking prenatal vitamin daily but advised to eat foods high in iron like red meats, green leafy vegetables, and beans. Also, cooking in a cast iron skillet can help increase the iron in your foods. Verbalized understanding but requesting refill on phenergan.  Advised to check back with her pharmacy in the am.   Patient

## 2021-05-01 ENCOUNTER — Telehealth: Payer: Self-pay | Admitting: *Deleted

## 2021-05-16 ENCOUNTER — Ambulatory Visit: Payer: Medicaid Other | Admitting: *Deleted

## 2021-05-16 ENCOUNTER — Other Ambulatory Visit: Payer: Self-pay

## 2021-05-16 ENCOUNTER — Encounter: Payer: Self-pay | Admitting: Advanced Practice Midwife

## 2021-05-16 ENCOUNTER — Other Ambulatory Visit (HOSPITAL_COMMUNITY)
Admission: RE | Admit: 2021-05-16 | Discharge: 2021-05-16 | Disposition: A | Discharge status: Home / Self Care | Payer: Medicaid Other | Source: Ambulatory Visit | Attending: Advanced Practice Midwife | Admitting: Advanced Practice Midwife

## 2021-05-16 ENCOUNTER — Ambulatory Visit (INDEPENDENT_AMBULATORY_CARE_PROVIDER_SITE_OTHER): Payer: Medicaid Other | Admitting: Advanced Practice Midwife

## 2021-05-16 VITALS — BP 115/62 | HR 83 | Wt 146.0 lb

## 2021-05-16 DIAGNOSIS — Z3A15 15 weeks gestation of pregnancy: Secondary | ICD-10-CM | POA: Diagnosis not present

## 2021-05-16 DIAGNOSIS — Z124 Encounter for screening for malignant neoplasm of cervix: Secondary | ICD-10-CM | POA: Insufficient documentation

## 2021-05-16 DIAGNOSIS — Z3480 Encounter for supervision of other normal pregnancy, unspecified trimester: Secondary | ICD-10-CM | POA: Insufficient documentation

## 2021-05-16 DIAGNOSIS — O99012 Anemia complicating pregnancy, second trimester: Secondary | ICD-10-CM

## 2021-05-16 DIAGNOSIS — O09522 Supervision of elderly multigravida, second trimester: Secondary | ICD-10-CM

## 2021-05-16 DIAGNOSIS — F419 Anxiety disorder, unspecified: Secondary | ICD-10-CM

## 2021-05-16 DIAGNOSIS — O09529 Supervision of elderly multigravida, unspecified trimester: Secondary | ICD-10-CM | POA: Insufficient documentation

## 2021-05-16 DIAGNOSIS — Z113 Encounter for screening for infections with a predominantly sexual mode of transmission: Secondary | ICD-10-CM

## 2021-05-16 DIAGNOSIS — Z363 Encounter for antenatal screening for malformations: Secondary | ICD-10-CM

## 2021-05-16 DIAGNOSIS — O99019 Anemia complicating pregnancy, unspecified trimester: Secondary | ICD-10-CM | POA: Insufficient documentation

## 2021-05-16 DIAGNOSIS — F17200 Nicotine dependence, unspecified, uncomplicated: Secondary | ICD-10-CM

## 2021-05-16 DIAGNOSIS — F32A Depression, unspecified: Secondary | ICD-10-CM | POA: Insufficient documentation

## 2021-05-16 LAB — POCT URINALYSIS DIPSTICK OB
Blood, UA: NEGATIVE
Glucose, UA: NEGATIVE
Ketones, UA: NEGATIVE
Leukocytes, UA: NEGATIVE
Nitrite, UA: NEGATIVE
POC,PROTEIN,UA: NEGATIVE

## 2021-05-16 MED ORDER — BLOOD PRESSURE MONITOR MISC: 0 refills | Status: AC

## 2021-05-16 MED ORDER — ONDANSETRON 4 MG PO TBDP
4.0000 mg | ORAL_TABLET | Freq: Three times a day (TID) | ORAL | 3 refills | Status: DC | PRN
Start: 2021-05-16 — End: 2021-10-10

## 2021-05-16 MED ORDER — FERROUS FUMARATE 29 MG PO TABS: 1.0000 | ORAL_TABLET | Freq: Every day | ORAL | 3 refills | Status: DC

## 2021-05-16 MED ORDER — SERTRALINE HCL 25 MG PO TABS
25.0000 mg | ORAL_TABLET | Freq: Every day | ORAL | 3 refills | Status: DC
Start: 2021-05-16 — End: 2021-10-13

## 2021-05-16 NOTE — Patient Instructions (Signed)
Ashley David, thank you for choosing our office today! We appreciate the opportunity to meet your healthcare needs. You may receive a short survey by mail, e-mail, or through [employer]. If you are happy with your care we would appreciate if you could take just a few minutes to complete the survey questions. We read all of your comments and take your feedback very seriously. Thank you again for choosing our office.  [facility] [employer] [facility] at [facility]  [facility] at [facility] ([address] [place], [place] [address]) Entrance C, located off of [address] Free 24/7 valet parking   Nausea & Vomiting Have saltine crackers or pretzels by your bed and eat a few bites before you raise your head out of bed in the morning Eat small frequent meals throughout the day instead of large meals Drink plenty of fluids throughout the day to stay hydrated, just don't drink a lot of fluids with your meals.  This can make your stomach fill up faster making you feel sick Do not brush your teeth right after you eat Products with real ginger are good for nausea, like ginger ale and ginger hard candy Make sure it says made with real ginger! Sucking on sour candy like lemon heads is also good for nausea If your prenatal vitamins make you nauseated, take them at night so you will sleep through the nausea Sea Bands If you feel like you need medicine for the nausea & vomiting please let us know If you are unable to keep any fluids or food down please let us know   Constipation Drink plenty of fluid, preferably water, throughout the day Eat foods high in fiber such as fruits, vegetables, and grains Exercise, such as walking, is a good way to keep your bowels regular Drink warm fluids, especially warm prune juice, or decaf coffee Eat a 1/2 cup of real oatmeal (not instant), 1/2 cup applesauce, and 1/2-1 cup warm prune juice every day If needed, you may take Colace (docusate sodium) stool softener  once or twice a day to help keep the stool soft.  If you still are having problems with constipation, you may take Miralax once daily as needed to help keep your bowels regular.   Home Blood Pressure Monitoring for Patients   Your provider has recommended that you check your blood pressure (BP) at least once a week at home. If you do not have a blood pressure cuff at home, one will be provided for you. Contact your provider if you have not received your monitor within 1 week.   Helpful Tips for Accurate Home Blood Pressure Checks  Don't smoke, exercise, or drink caffeine 30 minutes before checking your BP Use the restroom before checking your BP (a full bladder can raise your pressure) Relax in a comfortable upright chair Feet on the ground Left arm resting comfortably on a flat surface at the level of your heart Legs uncrossed Back supported Sit quietly and don't talk Place the cuff on your bare arm Adjust snuggly, so that only two fingertips can fit between your skin and the top of the cuff Check 2 readings separated by at least one minute Keep a log of your BP readings For a visual, please reference this diagram: http://ccnc.care/bpdiagram  Provider Name: [facility]     Phone: [phone]  Zone 1: ALL CLEAR  Continue to monitor your symptoms:  BP reading is less than 140 (top number) or less than 90 (bottom  number)  No right upper stomach pain No headaches or seeing spots No feeling nauseated or throwing up No swelling in face and hands  Zone 2: CAUTION Call your doctor's office for any of the following:  BP reading is greater than 140 (top number) or greater than 90 (bottom number)  Stomach pain under your ribs in the middle or right side Headaches or seeing spots Feeling nauseated or throwing up Swelling in face and hands  Zone 3: EMERGENCY  Seek immediate medical care if you have any of the following:  BP reading is greater than160 (top number) or greater than  110 (bottom number) Severe headaches not improving with Tylenol Serious difficulty catching your breath Any worsening symptoms from Zone 2    First Trimester of Pregnancy The first trimester of pregnancy is from week 1 until the end of week 12 (months 1 through 3). A week after a sperm fertilizes an egg, the egg will implant on the wall of the uterus. This embryo will begin to develop into a baby. Genes from you and your partner are forming the baby. The female genes determine whether the baby is a boy or a girl. At 6-8 weeks, the eyes and face are formed, and the heartbeat can be seen on ultrasound. At the end of 12 weeks, all the baby's organs are formed.  Now that you are pregnant, you will want to do everything you can to have a healthy baby. Two of the most important things are to get good prenatal care and to follow your health care provider's instructions. Prenatal care is all the medical care you receive before the baby's birth. This care will help prevent, find, and treat any problems during the pregnancy and childbirth. BODY CHANGES Your body goes through many changes during pregnancy. The changes vary from woman to woman.  You may gain or lose a couple of pounds at first. You may feel sick to your stomach (nauseous) and throw up (vomit). If the vomiting is uncontrollable, call your health care provider. You may tire easily. You may develop headaches that can be relieved by medicines approved by your health care provider. You may urinate more often. Painful urination may mean you have a bladder infection. You may develop heartburn as a result of your pregnancy. You may develop constipation because certain hormones are causing the muscles that push waste through your intestines to slow down. You may develop hemorrhoids or swollen, bulging veins (varicose veins). Your breasts may begin to grow larger and become tender. Your nipples may stick out more, and the tissue that surrounds them  (areola) may become darker. Your gums may bleed and may be sensitive to brushing and flossing. Dark spots or blotches (chloasma, mask of pregnancy) may develop on your face. This will likely fade after the baby is born. Your menstrual periods will stop. You may have a loss of appetite. You may develop cravings for certain kinds of food. You may have changes in your emotions from day to day, such as being excited to be pregnant or being concerned that something may go wrong with the pregnancy and baby. You may have more vivid and strange dreams. You may have changes in your hair. These can include thickening of your hair, rapid growth, and changes in texture. Some women also have hair loss during or after pregnancy, or hair that feels dry or thin. Your hair will most likely return to normal after your baby is born. WHAT TO EXPECT AT YOUR PRENATAL  VISITS During a routine prenatal visit: You will be weighed to make sure you and the baby are growing normally. Your blood pressure will be taken. Your abdomen will be measured to track your baby's growth. The fetal heartbeat will be listened to starting around week 10 or 12 of your pregnancy. Test results from any previous visits will be discussed. Your health care provider may ask you: How you are feeling. If you are feeling the baby move. If you have had any abnormal symptoms, such as leaking fluid, bleeding, severe headaches, or abdominal cramping. If you have any questions. Other tests that may be performed during your first trimester include: Blood tests to find your blood type and to check for the presence of any previous infections. They will also be used to check for low iron levels (anemia) and Rh antibodies. Later in the pregnancy, blood tests for diabetes will be done along with other tests if problems develop. Urine tests to check for infections, diabetes, or protein in the urine. An ultrasound to confirm the proper growth and development  of the baby. An amniocentesis to check for possible genetic problems. Fetal screens for spina bifida and Down syndrome. You may need other tests to make sure you and the baby are doing well. HOME CARE INSTRUCTIONS  Medicines Follow your health care provider's instructions regarding medicine use. Specific medicines may be either safe or unsafe to take during pregnancy. Take your prenatal vitamins as directed. If you develop constipation, try taking a stool softener if your health care provider approves. Diet Eat regular, well-balanced meals. Choose a variety of foods, such as meat or vegetable-based protein, fish, milk and low-fat dairy products, vegetables, fruits, and whole grain breads and cereals. Your health care provider will help you determine the amount of weight gain that is right for you. Avoid raw meat and uncooked cheese. These carry germs that can cause birth defects in the baby. Eating four or five small meals rather than three large meals a day may help relieve nausea and vomiting. If you start to feel nauseous, eating a few soda crackers can be helpful. Drinking liquids between meals instead of during meals also seems to help nausea and vomiting. If you develop constipation, eat more high-fiber foods, such as fresh vegetables or fruit and whole grains. Drink enough fluids to keep your urine clear or pale yellow. Activity and Exercise Exercise only as directed by your health care provider. Exercising will help you: Control your weight. Stay in shape. Be prepared for labor and delivery. Experiencing pain or cramping in the lower abdomen or low back is a good sign that you should stop exercising. Check with your health care provider before continuing normal exercises. Try to avoid standing for long periods of time. Move your legs often if you must stand in one place for a long time. Avoid heavy lifting. Wear low-heeled shoes, and practice good posture. You may continue to have sex  unless your health care provider directs you otherwise. Relief of Pain or Discomfort Wear a good support bra for breast tenderness.   Take warm sitz baths to soothe any pain or discomfort caused by hemorrhoids. Use hemorrhoid cream if your health care provider approves.   Rest with your legs elevated if you have leg cramps or low back pain. If you develop varicose veins in your legs, wear support hose. Elevate your feet for 15 minutes, 3-4 times a day. Limit salt in your diet. Prenatal Care Schedule your prenatal visits by the  twelfth week of pregnancy. They are usually scheduled monthly at first, then more often in the last 2 months before delivery. Write down your questions. Take them to your prenatal visits. Keep all your prenatal visits as directed by your health care provider. Safety Wear your seat belt at all times when driving. Make a list of emergency phone numbers, including numbers for family, friends, the hospital, and police and fire departments. General Tips Ask your health care provider for a referral to a local prenatal education class. Begin classes no later than at the beginning of month 6 of your pregnancy. Ask for help if you have counseling or nutritional needs during pregnancy. Your health care provider can offer advice or refer you to specialists for help with various needs. Do not use hot tubs, steam rooms, or saunas. Do not douche or use tampons or scented sanitary pads. Do not cross your legs for long periods of time. Avoid cat litter boxes and soil used by cats. These carry germs that can cause birth defects in the baby and possibly loss of the fetus by miscarriage or stillbirth. Avoid all smoking, herbs, alcohol, and medicines not prescribed by your health care provider. Chemicals in these affect the formation and growth of the baby. Schedule a dentist appointment. At home, brush your teeth with a soft toothbrush and be gentle when you floss. SEEK MEDICAL CARE IF:   You have dizziness. You have mild pelvic cramps, pelvic pressure, or nagging pain in the abdominal area. You have persistent nausea, vomiting, or diarrhea. You have a bad smelling vaginal discharge. You have pain with urination. You notice increased swelling in your face, hands, legs, or ankles. SEEK IMMEDIATE MEDICAL CARE IF:  You have a fever. You are leaking fluid from your vagina. You have spotting or bleeding from your vagina. You have severe abdominal cramping or pain. You have rapid weight gain or loss. You vomit blood or material that looks like coffee grounds. You are exposed to [place] measles and have never had them. You are exposed to fifth disease or chickenpox. You develop a severe headache. You have shortness of breath. You have any kind of trauma, such as from a fall or a car accident. Document Released: 11/12/2001 Document Revised: 04/04/2014 Document Reviewed: 09/28/2013 [facility] Patient Information 2015 [place], [place]. This information is not intended to replace advice given to you by your health care provider. Make sure you discuss any questions you have with your health care provider.

## 2021-05-16 NOTE — Progress Notes (Signed)
INITIAL OBSTETRICAL VISIT Patient name: Ashley David MRN [id number]  Date of birth: [birth year] Chief Complaint:   Initial Prenatal Visit (Nausea, heartburn)  History of Present Illness:   Ashley David is a 35 y.o. G19P2002 Caucasian female at [redacted]w[redacted]d by [place] at 12.4 weeks with an Estimated Date of Delivery: 11/02/21 being seen today for her initial obstetrical visit.   Patient's last menstrual period was 02/05/2021 (exact date). Her obstetrical history is significant for  SVB x 2 (2009 and 2011) .   Today she reports  nausea; anxiety becoming worse and requesting meds .  Last pap unsure. Results were:  neg per pt report  Depression screen [facility] 2/9 05/16/2021 09/04/2018 09/04/2018  Decreased Interest 1 2 2   Down, Depressed, Hopeless 1 2 2   PHQ - 2 Score 2 4 4   Altered sleeping 2 3 -  Tired, decreased energy 3 3 -  Change in appetite 0 1 -  Feeling bad or failure about yourself  1 1 -  Trouble concentrating 0 2 -  Moving slowly or fidgety/restless 0 0 -  Suicidal thoughts 0 1 -  PHQ-9 Score 8 15 -  Difficult doing work/chores - Very difficult -     GAD 7 : Generalized Anxiety Score 05/16/2021  Nervous, Anxious, on Edge 1  Control/stop worrying 1  Worry too much - different things 1  Trouble relaxing 1  Restless 0  Easily annoyed or irritable 2  Afraid - awful might happen 0  Total GAD 7 Score 6     Review of Systems:   Pertinent items are noted in HPI Denies cramping/contractions, leakage of fluid, vaginal bleeding, abnormal vaginal discharge w/ itching/odor/irritation, headaches, visual changes, shortness of breath, chest pain, abdominal pain, severe nausea/vomiting, or problems with urination or bowel movements unless otherwise stated above.  Pertinent History Reviewed:  Reviewed past medical,surgical, social, obstetrical and family history.  Reviewed problem list, medications and allergies. OB History  Gravida Para Term Preterm AB Living  3 2 2     2   SAB IAB Ectopic  Multiple Live Births          2    # Outcome Date GA Lbr Len/2nd Weight Sex Delivery Anes PTL Lv  3 Current           2 Term 01/22/10 [redacted]w[redacted]d  7 lb (3.175 kg) M Vag-Spont EPI  LIV  1 Term 08/17/08 [redacted]w[redacted]d  7 lb (3.175 kg) F Vag-Spont EPI N LIV   Physical Assessment:   Vitals:   05/16/21 1021  BP: 115/62  Pulse: 83  Weight: 146 lb (66.2 kg)  Body mass index is 27.59 kg/m.       Physical Examination:  General appearance - well appearing, and in no distress  Mental status - alert, oriented to person, place, and time  Psych:  She has a normal mood and affect  Skin - warm and dry, normal color, no suspicious lesions noted  Chest - effort normal, all lung fields clear to auscultation bilaterally  Heart - normal rate and regular rhythm  Abdomen - soft, nontender; FHRs 160bpm  Extremities:  No swelling or varicosities noted  Pelvic - VULVA: normal appearing vulva with no masses, tenderness or lesions  VAGINA: normal appearing vagina with normal color and discharge, no lesions  CERVIX: normal appearing cervix without discharge or lesions, no CMT  Thin prep pap is done with HR HPV cotesting  Chaperone: Ashley David    NT from 5/27: 08/19/08 12+4 wks,single IUP,FHR  160 bpm,NB present,NT 1.4 mm,CRL 60.20 mm,posterior placenta gr 0,normal ovaries  Results for orders placed or performed in visit on 05/16/21 (from the past 24 hour(s))  POC Urinalysis Dipstick OB   Collection Time: 05/16/21 10:47 AM  Result Value Ref Range   Color, UA     Clarity, UA     Glucose, UA Negative Negative   Bilirubin, UA     Ketones, UA neg    Spec Grav, UA     Blood, UA neg    pH, UA     POC,PROTEIN,UA Negative Negative, Trace, Small (1+), Moderate (2+), Large (3+), 4+   Urobilinogen, UA     Nitrite, UA neg    Leukocytes, UA Negative Negative   Appearance     Odor      Assessment & Plan:  1) Low-Risk Pregnancy G3P2002 at [redacted]w[redacted]d with an Estimated Date of Delivery: 11/02/21   2) Initial OB visit  3) AMA, age 68  at delivery, no change in plan of care  4) Smoker, has cut down to 4-6/day; [place] [facility] referral offered  5) Anxiety/depression, requesting meds; PHA-9= 8; will let us know re [facility] referral  6) Anemia, Hgb 10.1 last month, taking po Fe; will recheck with PN2  Meds:  Meds ordered this encounter  Medications   Blood Pressure Monitor MISC    Sig: For regular home bp monitoring during pregnancy    Dispense:  1 each    Refill:  0    Z34.82 Please mail to patient   ondansetron (ZOFRAN ODT) 4 MG disintegrating tablet    Sig: Take 1 tablet (4 mg total) by mouth every 8 (eight) hours as needed for nausea or vomiting.    Dispense:  30 tablet    Refill:  3    Order Specific Question:   Supervising Provider    Answer:   Duane Lope H [[id number]]   Ferrous Fumarate 29 MG TABS    Sig: Take 1 tablet by mouth daily with breakfast.    Dispense:  90 tablet    Refill:  3    Order Specific Question:   Supervising Provider    Answer:   Duane Lope H [[id number]]   sertraline (ZOLOFT) 25 MG tablet    Sig: Take 1 tablet (25 mg total) by mouth daily.    Dispense:  30 tablet    Refill:  3    Order Specific Question:   Supervising Provider    Answer:   Duane Lope H [[id number]]    Initial labs obtained Continue prenatal vitamins Reviewed n/v relief measures and warning s/s to report Reviewed recommended weight gain based on pre-gravid BMI Encouraged well-balanced diet Genetic & carrier screening discussed: requests Panorama and NT/IT, declines Horizon  Ultrasound discussed; fetal survey: requested CCNC completed> form faxed if has or is planning to apply for medicaid The nature of [facility] [employer] with multiple MDs and other Advanced Practice Providers was explained to patient; also emphasized that fellows, residents, and students are part of our team. Does not have home bp cuff. Office bp cuff given: no. Rx sent: yes. Check bp weekly, let us know if consistently >140/90.   No  indications for ASA therapy or early HgbA1c(per uptodate)  Follow-up: Return in about 4 weeks (around 06/13/2021) for LROB, [place]: Anatomy.   Orders Placed This Encounter  Procedures   Urine Culture   US OB Comp + 14 Wk   INTEGRATED 2   Pain Management Screening  Profile (10S)   Hgb Fractionation Cascade   POC Urinalysis Dipstick OB    Ashley David [facility] 05/16/2021 10:56 AM

## 2021-05-17 LAB — PMP SCREEN PROFILE (10S), URINE
Amphetamine Scrn, Ur: NEGATIVE ng/mL
BARBITURATE SCREEN URINE: NEGATIVE ng/mL
BENZODIAZEPINE SCREEN, URINE: NEGATIVE ng/mL
CANNABINOIDS UR QL SCN: NEGATIVE ng/mL
Cocaine (Metab) Scrn, Ur: NEGATIVE ng/mL
Creatinine(Crt), U: 43.6 mg/dL (ref 20.0–300.0)
Methadone Screen, Urine: NEGATIVE ng/mL
OXYCODONE+OXYMORPHONE UR QL SCN: NEGATIVE ng/mL
Opiate Scrn, Ur: NEGATIVE ng/mL
Ph of Urine: 7.2 (ref 4.5–8.9)
Phencyclidine Qn, Ur: NEGATIVE ng/mL
Propoxyphene Scrn, Ur: NEGATIVE ng/mL

## 2021-05-18 LAB — CYTOLOGY - PAP
Chlamydia: NEGATIVE
Comment: NEGATIVE
Comment: NEGATIVE
Comment: NORMAL
Diagnosis: NEGATIVE
High risk HPV: NEGATIVE
Neisseria Gonorrhea: NEGATIVE

## 2021-05-19 LAB — URINE CULTURE

## 2021-05-21 LAB — HGB FRACTIONATION CASCADE
Hgb A2: 2.7 % (ref 1.8–3.2)
Hgb A: 97.3 % (ref 96.4–98.8)
Hgb F: 0 % (ref 0.0–2.0)
Hgb S: 0 %

## 2021-05-21 LAB — INTEGRATED 2
AFP MoM: 1.32
Alpha-Fetoprotein: 42.4 ng/mL
Crown Rump Length: 60.2 mm
DIA MoM: 2.49
DIA Value: 422.4 pg/mL
Estriol, Unconjugated: 0.66 ng/mL
Gest. Age on Collection Date: 12.3 weeks
Gestational Age: 15.4 weeks
Maternal Age at EDD: 35.4 yr
Nuchal Translucency (NT): 1.4 mm
Nuchal Translucency MoM: 1.04
Number of Fetuses: 1
PAPP-A MoM: 0.77
PAPP-A Value: 779 ng/mL
Test Results:: NEGATIVE
Weight: 137 [lb_av]
Weight: 137 [lb_av]
hCG MoM: 2.1
hCG Value: 94.1 IU/mL
uE3 MoM: 0.81

## 2021-06-15 ENCOUNTER — Ambulatory Visit (INDEPENDENT_AMBULATORY_CARE_PROVIDER_SITE_OTHER): Payer: Medicaid Other | Admitting: Advanced Practice Midwife

## 2021-06-15 ENCOUNTER — Ambulatory Visit (INDEPENDENT_AMBULATORY_CARE_PROVIDER_SITE_OTHER): Payer: Medicaid Other

## 2021-06-15 ENCOUNTER — Other Ambulatory Visit: Payer: Self-pay

## 2021-06-15 VITALS — BP 111/59 | HR 79 | Wt 150.5 lb

## 2021-06-15 DIAGNOSIS — O09522 Supervision of elderly multigravida, second trimester: Secondary | ICD-10-CM

## 2021-06-15 DIAGNOSIS — Z3A20 20 weeks gestation of pregnancy: Secondary | ICD-10-CM | POA: Diagnosis not present

## 2021-06-15 DIAGNOSIS — O99012 Anemia complicating pregnancy, second trimester: Secondary | ICD-10-CM

## 2021-06-15 DIAGNOSIS — Z363 Encounter for antenatal screening for malformations: Secondary | ICD-10-CM

## 2021-06-15 DIAGNOSIS — Z3480 Encounter for supervision of other normal pregnancy, unspecified trimester: Secondary | ICD-10-CM

## 2021-06-15 NOTE — Progress Notes (Signed)
[place] 20 wks,cephalic,posterior placenta gr 0,normal ovaries,cx 5.1 cm,svp of fluid 3.9 cm,fhr 154 bpm,EFW 312 g 33%,anatomy complete,no obvious abnormalities

## 2021-06-15 NOTE — Patient Instructions (Signed)
Ashley David, thank you for choosing our office today! We appreciate the opportunity to meet your healthcare needs. You may receive a short survey by mail, e-mail, or through [employer]. If you are happy with your care we would appreciate if you could take just a few minutes to complete the survey questions. We read all of your comments and take your feedback very seriously. Thank you again for choosing our office.  Center for [employer] Team at [facility] at [facility] ([address] [place], [place] [address]) Entrance C, located off of [address] Free 24/7 valet parking  Go to [email or web address] to register for FREE online childbirth classes  Call the office [phone]) or go to [facility] if: You begin to severe cramping Your water breaks.  Sometimes it is a big gush of fluid, sometimes it is just a trickle that keeps getting your panties wet or running down your legs You have vaginal bleeding.  It is normal to have a small amount of spotting if your cervix was checked.   [facility] Pediatricians/Family Doctors [facility]): [address] Colette Ribas, [phone]           [facility]: [address], [phone]                [facility]): [address], [phone] (call to ask if accepting patients) [facility]: [address], [place], [phone]    [facility] Pediatricians/Family Doctors [facility]): [phone] [address], [phone] [facility]: [address] [place], [phone] [facility]: [address], [phone]  [facility] Doctors  [facility]): [phone] [facility]: [address] [phone]   [facility] Doctors [facility]: [address], [phone]  [facility] Doctors  [facility]: [phone], [phone]  Home Blood Pressure Monitoring for Patients   Your provider has recommended that you check your blood pressure (BP) at least once a week at home. If you do not have a blood pressure cuff at home, one will be provided for you. Contact your provider if you have not received your monitor within 1 week.   Helpful Tips for Accurate Home Blood Pressure Checks  Don't smoke, exercise, or drink caffeine 30 minutes before checking your BP Use the restroom before checking your BP (a full bladder can raise your pressure) Relax in a comfortable upright chair Feet on the ground Left arm resting comfortably on a flat surface at the level of your heart Legs uncrossed Back supported Sit quietly and don't talk Place the cuff on your bare arm Adjust snuggly, so that only two fingertips can fit between your skin and the top of the cuff Check 2 readings separated by at least one minute Keep a log of your BP readings For a visual, please reference this diagram: http://ccnc.care/bpdiagram  Provider Name: [facility]     Phone: [phone]  Zone 1: ALL CLEAR  Continue to monitor your symptoms:  BP reading is less than 140 (top number) or less than 90 (bottom number)  No right upper stomach pain No headaches or seeing spots No feeling nauseated or throwing up No swelling in face and hands  Zone 2: CAUTION Call your doctor's office for any of the following:  BP reading is greater than 140 (top number) or greater than  90 (bottom number)  Stomach pain under your ribs in the middle or right side Headaches or seeing spots Feeling nauseated or throwing up Swelling in face and hands  Zone 3: EMERGENCY  Seek immediate medical care if you have any of the following:  BP reading is greater than160 (top number) or greater than 110 (bottom number) Severe headaches not improving with Tylenol Serious difficulty catching your breath Any worsening symptoms from  Zone 2     Second Trimester of Pregnancy The second trimester is from week 14 through week 27 (months 4 through 6). The second trimester is often a time when you feel your best. Your body has adjusted to being pregnant, and you begin to feel better physically. Usually, morning sickness has lessened or quit completely, you may have more energy, and you may have an increase in appetite. The second trimester is also a time when the fetus is growing rapidly. At the end of the sixth month, the fetus is about 9 inches long and weighs about 1 pounds. You will likely begin to feel the baby move (quickening) between 16 and 20 weeks of pregnancy. Body changes during your second trimester Your body continues to go through many changes during your second trimester. The changes vary from woman to woman. Your weight will continue to increase. You will notice your lower abdomen bulging out. You may begin to get stretch marks on your hips, abdomen, and breasts. You may develop headaches that can be relieved by medicines. The medicines should be approved by your health care provider. You may urinate more often because the fetus is pressing on your bladder. You may develop or continue to have heartburn as a result of your pregnancy. You may develop constipation because certain hormones are causing the muscles that push waste through your intestines to slow down. You may develop hemorrhoids or swollen, bulging veins (varicose veins). You may have back pain. This is caused by: Weight gain. Pregnancy hormones that are relaxing the joints in your pelvis. A shift in weight and the muscles that support your balance. Your breasts will continue to grow and they will continue to become tender. Your gums may bleed and may be sensitive to brushing and flossing. Dark spots or blotches (chloasma, mask of pregnancy) may develop on your face. This will likely fade after the baby is born. A dark line from your belly button to  the pubic area (linea nigra) may appear. This will likely fade after the baby is born. You may have changes in your hair. These can include thickening of your hair, rapid growth, and changes in texture. Some women also have hair loss during or after pregnancy, or hair that feels dry or thin. Your hair will most likely return to normal after your baby is born.  What to expect at prenatal visits During a routine prenatal visit: You will be weighed to make sure you and the fetus are growing normally. Your blood pressure will be taken. Your abdomen will be measured to track your baby's growth. The fetal heartbeat will be listened to. Any test results from the previous visit will be discussed.  Your health care provider may ask you: How you are feeling. If you are feeling the baby move. If you have had any abnormal symptoms, such as leaking fluid, bleeding, severe headaches, or abdominal cramping. If you are using any tobacco products, including cigarettes, chewing tobacco, and electronic cigarettes. If you have any questions.  Other tests that may be performed during   your second trimester include: Blood tests that check for: Low iron levels (anemia). High blood sugar that affects pregnant women (gestational diabetes) between 24 and 28 weeks. Rh antibodies. This is to check for a protein on red blood cells (Rh factor). Urine tests to check for infections, diabetes, or protein in the urine. An ultrasound to confirm the proper growth and development of the baby. An amniocentesis to check for possible genetic problems. Fetal screens for spina bifida and Down syndrome. HIV (human immunodeficiency virus) testing. Routine prenatal testing includes screening for HIV, unless you choose not to have this test.  Follow these instructions at home: Medicines Follow your health care provider's instructions regarding medicine use. Specific medicines may be either safe or unsafe to take during  pregnancy. Take a prenatal vitamin that contains at least 600 micrograms (mcg) of folic acid. If you develop constipation, try taking a stool softener if your health care provider approves. Eating and drinking Eat a balanced diet that includes fresh fruits and vegetables, whole grains, good sources of protein such as meat, eggs, or tofu, and low-fat dairy. Your health care provider will help you determine the amount of weight gain that is right for you. Avoid raw meat and uncooked cheese. These carry germs that can cause birth defects in the baby. If you have low calcium intake from food, talk to your health care provider about whether you should take a daily calcium supplement. Limit foods that are high in fat and processed sugars, such as fried and sweet foods. To prevent constipation: Drink enough fluid to keep your urine clear or pale yellow. Eat foods that are high in fiber, such as fresh fruits and vegetables, whole grains, and beans. Activity Exercise only as directed by your health care provider. Most women can continue their usual exercise routine during pregnancy. Try to exercise for 30 minutes at least 5 days a week. Stop exercising if you experience uterine contractions. Avoid heavy lifting, wear low heel shoes, and practice good posture. A sexual relationship may be continued unless your health care provider directs you otherwise. Relieving pain and discomfort Wear a good support bra to prevent discomfort from breast tenderness. Take warm sitz baths to soothe any pain or discomfort caused by hemorrhoids. Use hemorrhoid cream if your health care provider approves. Rest with your legs elevated if you have leg cramps or low back pain. If you develop varicose veins, wear support hose. Elevate your feet for 15 minutes, 3-4 times a day. Limit salt in your diet. Prenatal Care Write down your questions. Take them to your prenatal visits. Keep all your prenatal visits as told by your health  care provider. This is important. Safety Wear your seat belt at all times when driving. Make a list of emergency phone numbers, including numbers for family, friends, the hospital, and police and fire departments. General instructions Ask your health care provider for a referral to a local prenatal education class. Begin classes no later than the beginning of month 6 of your pregnancy. Ask for help if you have counseling or nutritional needs during pregnancy. Your health care provider can offer advice or refer you to specialists for help with various needs. Do not use hot tubs, steam rooms, or saunas. Do not douche or use tampons or scented sanitary pads. Do not cross your legs for long periods of time. Avoid cat litter boxes and soil used by cats. These carry germs that can cause birth defects in the baby and possibly loss of the   fetus by miscarriage or stillbirth. Avoid all smoking, herbs, alcohol, and unprescribed drugs. Chemicals in these products can affect the formation and growth of the baby. Do not use any products that contain nicotine or tobacco, such as cigarettes and e-cigarettes. If you need help quitting, ask your health care provider. Visit your dentist if you have not gone yet during your pregnancy. Use a soft toothbrush to brush your teeth and be gentle when you floss. Contact a health care provider if: You have dizziness. You have mild pelvic cramps, pelvic pressure, or nagging pain in the abdominal area. You have persistent nausea, vomiting, or diarrhea. You have a bad smelling vaginal discharge. You have pain when you urinate. Get help right away if: You have a fever. You are leaking fluid from your vagina. You have spotting or bleeding from your vagina. You have severe abdominal cramping or pain. You have rapid weight gain or weight loss. You have shortness of breath with chest pain. You notice sudden or extreme swelling of your face, hands, ankles, feet, or legs. You  have not felt your baby move in over an hour. You have severe headaches that do not go away when you take medicine. You have vision changes. Summary The second trimester is from week 14 through week 27 (months 4 through 6). It is also a time when the fetus is growing rapidly. Your body goes through many changes during pregnancy. The changes vary from woman to woman. Avoid all smoking, herbs, alcohol, and unprescribed drugs. These chemicals affect the formation and growth your baby. Do not use any tobacco products, such as cigarettes, chewing tobacco, and e-cigarettes. If you need help quitting, ask your health care provider. Contact your health care provider if you have any questions. Keep all prenatal visits as told by your health care provider. This is important. This information is not intended to replace advice given to you by your health care provider. Make sure you discuss any questions you have with your health care provider. Document Released: 11/12/2001 Document Revised: 04/25/2016 Document Reviewed: 01/19/2013 Elsevier Interactive Patient Education  2017 Elsevier Inc[employer]  

## 2021-06-15 NOTE — Progress Notes (Signed)
   LOW-RISK PREGNANCY VISIT Patient name: Ashley David MRN [id number]  Date of birth: [birth year] Chief Complaint:   Routine Prenatal Visit  History of Present Illness:   Ashley David is a 35 y.o. G101P2002 female at [redacted]w[redacted]d with an Estimated Date of Delivery: 11/02/21 being seen today for ongoing management of a low-risk pregnancy.  Today she reports  still needing Phenergan daily; has taken Zoloft sporadically, but wants reassurance it is safe in preg . Contractions: Not present. Vag. Bleeding: None.   . denies leaking of fluid. Review of Systems:   Pertinent items are noted in HPI Denies abnormal vaginal discharge w/ itching/odor/irritation, headaches, visual changes, shortness of breath, chest pain, abdominal pain, severe nausea/vomiting, or problems with urination or bowel movements unless otherwise stated above. Pertinent History Reviewed:  Reviewed past medical,surgical, social, obstetrical and family history.  Reviewed problem list, medications and allergies. Physical Assessment:   Vitals:   06/15/21 0924  BP: (!) 111/59  Pulse: 79  Weight: 150 lb 8 oz (68.3 kg)  Body mass index is 28.44 kg/m.        Physical Examination:   General appearance: Well appearing, and in no distress  Mental status: Alert, oriented to person, place, and time  Skin: Warm & dry  Cardiovascular: Normal heart rate noted  Respiratory: Normal respiratory effort, no distress  Abdomen: Soft, gravid, nontender  Pelvic: Cervical exam deferred         Extremities: Edema: None  Fetal Status: Fetal Heart Rate (bpm): 154 u/s        Anatomy: [place] 20 wks,cephalic,posterior placenta gr 0,normal ovaries,cx 5.1 cm,svp of fluid 3.9 cm,fhr 154 bpm,EFW 312 g 33%,anatomy complete,no obvious abnormalities  No results found for this or any previous visit (from the past 24 hour(s)).  Assessment & Plan:  1) Low-risk pregnancy G3P2002 at [redacted]w[redacted]d with an Estimated Date of Delivery: 11/02/21   2) Anxiety/depression, will start  taking Zoloft daily and let us know if she wants a referral to [facility]  3) May want ppBTL, will sign papers ~ 28wks to keep the option open; may want Nex if not   Meds: No orders of the defined types were placed in this encounter.  Labs/procedures today: anatomy u/s  Plan:  Continue routine obstetrical care   Reviewed: Preterm labor symptoms and general obstetric precautions including but not limited to vaginal bleeding, contractions, leaking of fluid and fetal movement were reviewed in detail with the patient.  All questions were answered. Didn't ask about home bp cuff. Check bp weekly, let us know if >140/90.   Follow-up: Return in about 4 weeks (around 07/13/2021) for LROB, in person.  No orders of the defined types were placed in this encounter.  [name] CNM 06/15/2021 10:02 AM

## 2021-07-16 ENCOUNTER — Ambulatory Visit (INDEPENDENT_AMBULATORY_CARE_PROVIDER_SITE_OTHER): Payer: Medicaid Other | Admitting: Women's Health

## 2021-07-16 ENCOUNTER — Encounter: Payer: Self-pay | Admitting: Women's Health

## 2021-07-16 ENCOUNTER — Other Ambulatory Visit: Payer: Self-pay

## 2021-07-16 VITALS — BP 121/67 | HR 107 | Wt 154.0 lb

## 2021-07-16 DIAGNOSIS — Z3482 Encounter for supervision of other normal pregnancy, second trimester: Secondary | ICD-10-CM

## 2021-07-16 DIAGNOSIS — Z3480 Encounter for supervision of other normal pregnancy, unspecified trimester: Secondary | ICD-10-CM

## 2021-07-16 DIAGNOSIS — Z3A24 24 weeks gestation of pregnancy: Secondary | ICD-10-CM

## 2021-07-16 MED ORDER — PANTOPRAZOLE SODIUM 20 MG PO TBEC
20.0000 mg | DELAYED_RELEASE_TABLET | Freq: Every day | ORAL | 6 refills | Status: DC
Start: 2021-07-16 — End: 2021-08-14

## 2021-07-16 NOTE — Patient Instructions (Signed)
Ashley David, thank you for choosing our office today! We appreciate the opportunity to meet your healthcare needs. You may receive a short survey by mail, e-mail, or through [employer]. If you are happy with your care we would appreciate if you could take just a few minutes to complete the survey questions. We read all of your comments and take your feedback very seriously. Thank you again for choosing our office.  Center for [employer] Team at [facility]  [facility] at [facility] ([address] [place], [place] [address]) Entrance C, located off of [address] Free 24/7 valet parking   You will have your sugar test next visit.  Please do not eat or drink anything after midnight the night before you come, not even water.  You will be here for at least two hours.  Please make an appointment online for the bloodwork at [email or web address] for 8:00am (or as close to this as possible). Make sure you select the [facility] service center.   CLASSES: Go to [email or web address] to register for classes (childbirth, breastfeeding, waterbirth, infant CPR, daddy bootcamp, etc.)  Call the office [phone]) or go to [facility] if: You begin to have strong, frequent contractions Your water breaks.  Sometimes it is a big gush of fluid, sometimes it is just a trickle that keeps getting your panties wet or running down your legs You have vaginal bleeding.  It is normal to have a small amount of spotting if your cervix was checked.  You don't feel your baby moving like normal.  If you don't, get you something to eat and drink and lay down and focus on feeling your baby move.   If your baby is still not moving like normal, you should call the office or go to [facility].  Call the office [phone]) or go to [facility] hospital for these signs of pre-eclampsia: Severe headache that does not go away with Tylenol Visual changes- seeing spots, double, blurred vision Pain under your right breast or upper  abdomen that does not go away with Tums or heartburn medicine Nausea and/or vomiting Severe swelling in your hands, feet, and face    [facility] Pediatricians/Family Doctors [facility]): [address] Colette Ribas, [phone]           [facility]: [address], [phone]                [facility]): [address], [phone] (call to ask if accepting patients) [facility] Department: [address] [place], [phone]    [facility] Pediatricians/Family Doctors [facility]): [address], [phone] [facility]: [address] [place], [phone] [facility]: [address], [phone]  [facility] Doctors  [facility]): [phone] [facility]: [address], [phone]   [facility]: [address], [phone]  [facility] Doctors  [facility]: [phone], [phone]  Home Blood Pressure Monitoring for Patients   Your provider has recommended that you check your blood pressure (BP) at least once a week at home. If you do not have a blood pressure cuff at home, one will be provided for you. Contact your provider if you have not received your monitor within 1 week.   Helpful Tips for Accurate Home Blood Pressure Checks  Don't smoke, exercise, or drink  caffeine 30 minutes before checking your BP Use the restroom before checking your BP (a full bladder can raise your pressure) Relax in a comfortable upright chair Feet on the ground Left arm resting comfortably on a flat surface at the level of your heart Legs uncrossed Back supported Sit quietly and don't talk Place the cuff on your bare arm Adjust snuggly, so that only two fingertips can fit between your skin and the top of the cuff Check 2  readings separated by at least one minute Keep a log of your BP readings For a visual, please reference this diagram: http://ccnc.care/bpdiagram  Provider Name: [facility]     Phone: [phone]  Zone 1: ALL CLEAR  Continue to monitor your symptoms:  BP reading is less than 140 (top number) or less than 90 (bottom number)  No right upper stomach pain No headaches or seeing spots No feeling nauseated or throwing up No swelling in face and hands  Zone 2: CAUTION Call your doctor's office for any of the following:  BP reading is greater than 140 (top number) or greater than 90 (bottom number)  Stomach pain under your ribs in the middle or right side Headaches or seeing spots Feeling nauseated or throwing up Swelling in face and hands  Zone 3: EMERGENCY  Seek immediate medical care if you have any of the following:  BP reading is greater than160 (top number) or greater than 110 (bottom number) Severe headaches not improving with Tylenol Serious difficulty catching your breath Any worsening symptoms from Zone 2   Second Trimester of Pregnancy The second trimester is from week 13 through week 28, months 4 through 6. The second trimester is often a time when you feel your best. Your body has also adjusted to being pregnant, and you begin to feel better physically. Usually, morning sickness has lessened or quit completely, you may have more energy, and you may have an increase in appetite. The second trimester is also a time when the fetus is growing rapidly. At the end of the sixth month, the fetus is about 9 inches long and weighs about 1 pounds. You will likely begin to feel the baby move (quickening) between 18 and 20 weeks of the pregnancy. BODY CHANGES Your body goes through many changes during pregnancy. The changes vary from woman to woman.  Your weight will continue to increase. You will notice your lower abdomen bulging out. You may begin to get stretch marks on your  hips, abdomen, and breasts. You may develop headaches that can be relieved by medicines approved by your health care provider. You may urinate more often because the fetus is pressing on your bladder. You may develop or continue to have heartburn as a result of your pregnancy. You may develop constipation because certain hormones are causing the muscles that push waste through your intestines to slow down. You may develop hemorrhoids or swollen, bulging veins (varicose veins). You may have back pain because of the weight gain and pregnancy hormones relaxing your joints between the bones in your pelvis and as a result of a shift in weight and the muscles that support your balance. Your breasts will continue to grow and be tender. Your gums may bleed and may be sensitive to brushing and flossing. Dark spots or blotches (chloasma, mask of pregnancy) may develop on your face. This will likely fade after the baby is born. A dark line from your belly button to the pubic area (linea nigra) may appear. This  will likely fade after the baby is born. You may have changes in your hair. These can include thickening of your hair, rapid growth, and changes in texture. Some women also have hair loss during or after pregnancy, or hair that feels dry or thin. Your hair will most likely return to normal after your baby is born. WHAT TO EXPECT AT YOUR PRENATAL VISITS During a routine prenatal visit: You will be weighed to make sure you and the fetus are growing normally. Your blood pressure will be taken. Your abdomen will be measured to track your baby's growth. The fetal heartbeat will be listened to. Any test results from the previous visit will be discussed. Your health care provider may ask you: How you are feeling. If you are feeling the baby move. If you have had any abnormal symptoms, such as leaking fluid, bleeding, severe headaches, or abdominal cramping. If you have any questions. Other tests that may  be performed during your second trimester include: Blood tests that check for: Low iron levels (anemia). Gestational diabetes (between 24 and 28 weeks). Rh antibodies. Urine tests to check for infections, diabetes, or protein in the urine. An ultrasound to confirm the proper growth and development of the baby. An amniocentesis to check for possible genetic problems. Fetal screens for spina bifida and Down syndrome. HOME CARE INSTRUCTIONS  Avoid all smoking, herbs, alcohol, and unprescribed drugs. These chemicals affect the formation and growth of the baby. Follow your health care provider's instructions regarding medicine use. There are medicines that are either safe or unsafe to take during pregnancy. Exercise only as directed by your health care provider. Experiencing uterine cramps is a good sign to stop exercising. Continue to eat regular, healthy meals. Wear a good support bra for breast tenderness. Do not use hot tubs, steam rooms, or saunas. Wear your seat belt at all times when driving. Avoid raw meat, uncooked cheese, cat litter boxes, and soil used by cats. These carry germs that can cause birth defects in the baby. Take your prenatal vitamins. Try taking a stool softener (if your health care provider approves) if you develop constipation. Eat more high-fiber foods, such as fresh vegetables or fruit and whole grains. Drink plenty of fluids to keep your urine clear or pale yellow. Take warm sitz baths to soothe any pain or discomfort caused by hemorrhoids. Use hemorrhoid cream if your health care provider approves. If you develop varicose veins, wear support hose. Elevate your feet for 15 minutes, 3-4 times a day. Limit salt in your diet. Avoid heavy lifting, wear low heel shoes, and practice good posture. Rest with your legs elevated if you have leg cramps or low back pain. Visit your dentist if you have not gone yet during your pregnancy. Use a soft toothbrush to brush your teeth  and be gentle when you floss. A sexual relationship may be continued unless your health care provider directs you otherwise. Continue to go to all your prenatal visits as directed by your health care provider. SEEK MEDICAL CARE IF:  You have dizziness. You have mild pelvic cramps, pelvic pressure, or nagging pain in the abdominal area. You have persistent nausea, vomiting, or diarrhea. You have a bad smelling vaginal discharge. You have pain with urination. SEEK IMMEDIATE MEDICAL CARE IF:  You have a fever. You are leaking fluid from your vagina. You have spotting or bleeding from your vagina. You have severe abdominal cramping or pain. You have rapid weight gain or loss. You have shortness of   breath with chest pain. You notice sudden or extreme swelling of your face, hands, ankles, feet, or legs. You have not felt your baby move in over an hour. You have severe headaches that do not go away with medicine. You have vision changes. Document Released: 11/12/2001 Document Revised: 11/23/2013 Document Reviewed: 01/19/2013 [facility] Patient Information 2015 [place], [place]. This information is not intended to replace advice given to you by your health care provider. Make sure you discuss any questions you have with your health care provider.

## 2021-07-16 NOTE — Progress Notes (Signed)
LOW-RISK PREGNANCY VISIT Patient name: Ashley David MRN [id number]  Date of birth: 16-Jul-1986 Chief Complaint:   Routine Prenatal Visit (Heartburn getting worse)  History of Present Illness:   Ashley David is a 35 y.o. G38P2002 female at [redacted]w[redacted]d with an Estimated Date of Delivery: 11/02/21 being seen today for ongoing management of a low-risk pregnancy.   Today she reports heartburn/reflux-TUMS not helping anymore. Congestion since got pregnant, ears are a little muffled, no pain. Contractions: Not present. Vag. Bleeding: None.  Movement: Present. denies leaking of fluid.  Depression screen [facility] 2/9 05/16/2021 09/04/2018 09/04/2018  Decreased Interest 1 2 2   Down, Depressed, Hopeless 1 2 2   PHQ - 2 Score 2 4 4   Altered sleeping 2 3 -  Tired, decreased energy 3 3 -  Change in appetite 0 1 -  Feeling bad or failure about yourself  1 1 -  Trouble concentrating 0 2 -  Moving slowly or fidgety/restless 0 0 -  Suicidal thoughts 0 1 -  PHQ-9 Score 8 15 -  Difficult doing work/chores - Very difficult -     GAD 7 : Generalized Anxiety Score 05/16/2021  Nervous, Anxious, on Edge 1  Control/stop worrying 1  Worry too much - different things 1  Trouble relaxing 1  Restless 0  Easily annoyed or irritable 2  Afraid - awful might happen 0  Total GAD 7 Score 6      Review of Systems:   Pertinent items are noted in HPI Denies abnormal vaginal discharge w/ itching/odor/irritation, headaches, visual changes, shortness of breath, chest pain, abdominal pain, severe nausea/vomiting, or problems with urination or bowel movements unless otherwise stated above. Pertinent History Reviewed:  Reviewed past medical,surgical, social, obstetrical and family history.  Reviewed problem list, medications and allergies. Physical Assessment:   Vitals:   07/16/21 0900  BP: 121/67  Pulse: (!) 107  Weight: 154 lb (69.9 kg)  Body mass index is 29.1 kg/m.        Physical Examination:   General appearance:  Well appearing, and in no distress  Mental status: Alert, oriented to person, place, and time  Skin: Warm & dry  Cardiovascular: Normal heart rate noted  Respiratory: Normal respiratory effort, no distress  Abdomen: Soft, gravid, nontender  Pelvic: Cervical exam deferred         Extremities: Edema: None  Fetal Status: Fetal Heart Rate (bpm): 151 Fundal Height: 24 cm Movement: Present    Chaperone: N/A   No results found for this or any previous visit (from the past 24 hour(s)).  Assessment & Plan:  1) Low-risk pregnancy G3P2002 at [redacted]w[redacted]d with an Estimated Date of Delivery: 11/02/21   2) Reflux, rx protonix  3) Congestion> try otc decongestant   Meds:  Meds ordered this encounter  Medications   pantoprazole (PROTONIX) 20 MG tablet    Sig: Take 1 tablet (20 mg total) by mouth daily.    Dispense:  30 tablet    Refill:  6    Order Specific Question:   Supervising Provider    Answer:   07/18/21 [[id number]]   Labs/procedures today: none  Plan:  Continue routine obstetrical care  Next visit: prefers will be in person for pn2     Reviewed: Preterm labor symptoms and general obstetric precautions including but not limited to vaginal bleeding, contractions, leaking of fluid and fetal movement were reviewed in detail with the patient.  All questions were answered.  Follow-up: Return in about 4 weeks (around  08/13/2021) for LROB, PN2, CNM, in person.  Future Appointments  Date Time Provider Department Center  08/14/2021  8:30 AM [facility]  08/14/2021  8:50 AM Cheral Marker, CNM [facility]    No orders of the defined types were placed in this encounter.  Cheral Marker CNM, [facility] 07/16/2021 9:28 AM

## 2021-07-30 ENCOUNTER — Other Ambulatory Visit: Payer: Self-pay | Admitting: Women's Health

## 2021-08-14 ENCOUNTER — Encounter: Payer: Self-pay | Admitting: Women's Health

## 2021-08-14 ENCOUNTER — Other Ambulatory Visit: Payer: Medicaid Other

## 2021-08-14 ENCOUNTER — Other Ambulatory Visit: Payer: Self-pay

## 2021-08-14 ENCOUNTER — Ambulatory Visit (INDEPENDENT_AMBULATORY_CARE_PROVIDER_SITE_OTHER): Payer: Medicaid Other | Admitting: Women's Health

## 2021-08-14 VITALS — BP 123/62 | HR 91 | Wt 157.0 lb

## 2021-08-14 DIAGNOSIS — Z3480 Encounter for supervision of other normal pregnancy, unspecified trimester: Secondary | ICD-10-CM

## 2021-08-14 DIAGNOSIS — Z3482 Encounter for supervision of other normal pregnancy, second trimester: Secondary | ICD-10-CM

## 2021-08-14 DIAGNOSIS — Z23 Encounter for immunization: Secondary | ICD-10-CM

## 2021-08-14 DIAGNOSIS — Z3A28 28 weeks gestation of pregnancy: Secondary | ICD-10-CM

## 2021-08-14 MED ORDER — DOXYLAMINE-PYRIDOXINE 10-10 MG PO TBEC: DELAYED_RELEASE_TABLET | ORAL | 6 refills | Status: DC

## 2021-08-14 MED ORDER — PANTOPRAZOLE SODIUM 20 MG PO TBEC: 20.0000 mg | DELAYED_RELEASE_TABLET | Freq: Two times a day (BID) | ORAL | 6 refills | Status: AC

## 2021-08-14 NOTE — Addendum Note (Signed)
Addended by: Moss Mc on: 08/14/2021 09:24 AM   Modules accepted: Orders

## 2021-08-14 NOTE — Patient Instructions (Signed)
Ashley David, thank you for choosing our office today! We appreciate the opportunity to meet your healthcare needs. You may receive a short survey by mail, e-mail, or through [employer]. If you are happy with your care we would appreciate if you could take just a few minutes to complete the survey questions. We read all of your comments and take your feedback very seriously. Thank you again for choosing our office.  Center for [employer] Team at [facility]  [facility] ([address] [place], [place] [address]) Entrance C, located off of [address] Free 24/7 valet parking   CLASSES: Go to [email or web address] to register for classes (childbirth, breastfeeding, waterbirth, infant CPR, daddy bootcamp, etc.)  Call the office [phone]) or go to [facility] if: You begin to have strong, frequent contractions Your water breaks.  Sometimes it is a big gush of fluid, sometimes it is just a trickle that keeps getting your panties wet or running down your legs You have vaginal bleeding.  It is normal to have a small amount of spotting if your cervix was checked.  You don't feel your baby moving like normal.  If you don't, get you something to eat and drink and lay down and focus on feeling your baby move.   If your baby is still not moving like normal, you should call the office or go to [facility].  Call the office [phone]) or go to [facility] for these signs of pre-eclampsia: Severe headache that does not go away with Tylenol Visual changes- seeing spots, double, blurred vision Pain under your right breast or upper abdomen that does not go away with Tums or heartburn medicine Nausea and/or vomiting Severe swelling in your hands, feet, and face   Tdap Vaccine It is recommended that you get the Tdap vaccine during the third trimester of EACH pregnancy to help protect your baby from getting pertussis (whooping cough) 27-36 weeks is the BEST time to do  this so that you can pass the protection on to your baby. During pregnancy is better than after pregnancy, but if you are unable to get it during pregnancy it will be offered at the hospital.  You can get this vaccine with [place], at the health department, your family doctor, or some local pharmacies Everyone who will be around your baby should also be up-to-date on their vaccines before the baby comes. Adults (who are not pregnant) only need 1 dose of Tdap during adulthood.   [facility] Pediatricians/Family Doctors [facility]): [address] Colette Ribas, [phone]           [facility]: [address], [phone]                [facility]): [address], [phone] (call to ask if accepting patients) [facility]: [address], [place], [phone]    [facility] Pediatricians/Family Doctors [facility]): [phone] [address], [phone] [facility]: [address] [place], [phone] [facility]: [address], [phone]  [facility] Doctors  [facility]): [phone] [facility]: [address] [phone]   [facility] Doctors [facility]: [address] [phone]  [facility] Family Doctors  [facility]: [phone], [phone]  Home Blood Pressure Monitoring for Patients   Your provider has recommended that you check your  blood pressure (BP) at least once a week at home. If you do not have a blood pressure cuff at home, one will be provided for you. Contact your provider if you have not received your monitor within 1 week.   Helpful Tips for Accurate Home Blood Pressure Checks  Don't smoke, exercise, or drink caffeine 30 minutes before checking your BP Use the restroom before checking your BP (a full bladder can raise your  pressure) Relax in a comfortable upright chair Feet on the ground Left arm resting comfortably on a flat surface at the level of your heart Legs uncrossed Back supported Sit quietly and don't talk Place the cuff on your bare arm Adjust snuggly, so that only two fingertips can fit between your skin and the top of the cuff Check 2 readings separated by at least one minute Keep a log of your BP readings For a visual, please reference this diagram: http://ccnc.care/bpdiagram  Provider Name: [facility]     Phone: [phone]  Zone 1: ALL CLEAR  Continue to monitor your symptoms:  BP reading is less than 140 (top number) or less than 90 (bottom number)  No right upper stomach pain No headaches or seeing spots No feeling nauseated or throwing up No swelling in face and hands  Zone 2: CAUTION Call your doctor's office for any of the following:  BP reading is greater than 140 (top number) or greater than 90 (bottom number)  Stomach pain under your ribs in the middle or right side Headaches or seeing spots Feeling nauseated or throwing up Swelling in face and hands  Zone 3: EMERGENCY  Seek immediate medical care if you have any of the following:  BP reading is greater than160 (top number) or greater than 110 (bottom number) Severe headaches not improving with Tylenol Serious difficulty catching your breath Any worsening symptoms from Zone 2   Third Trimester of Pregnancy The third trimester is from week 29 through week 42, months 7 through 9. The third trimester is a time when the fetus is growing rapidly. At the end of the ninth month, the fetus is about 20 inches in length and weighs 6-10 pounds.  BODY CHANGES Your body goes through many changes during pregnancy. The changes vary from woman to woman.  Your weight will continue to increase. You can expect to gain 25-35 pounds (11-16 kg) by the end of the pregnancy. You may begin to get stretch marks on your hips, abdomen,  and breasts. You may urinate more often because the fetus is moving lower into your pelvis and pressing on your bladder. You may develop or continue to have heartburn as a result of your pregnancy. You may develop constipation because certain hormones are causing the muscles that push waste through your intestines to slow down. You may develop hemorrhoids or swollen, bulging veins (varicose veins). You may have pelvic pain because of the weight gain and pregnancy hormones relaxing your joints between the bones in your pelvis. Backaches may result from overexertion of the muscles supporting your posture. You may have changes in your hair. These can include thickening of your hair, rapid growth, and changes in texture. Some women also have hair loss during or after pregnancy, or hair that feels dry or thin. Your hair will most likely return to normal after your baby is born. Your breasts will continue to grow and be tender. A yellow discharge may leak from your breasts called colostrum. Your belly button may stick out. You may   feel short of breath because of your expanding uterus. You may notice the fetus "dropping," or moving lower in your abdomen. You may have a bloody mucus discharge. This usually occurs a few days to a week before labor begins. Your cervix becomes thin and soft (effaced) near your due date. WHAT TO EXPECT AT YOUR PRENATAL EXAMS  You will have prenatal exams every 2 weeks until week 36. Then, you will have weekly prenatal exams. During a routine prenatal visit: You will be weighed to make sure you and the fetus are growing normally. Your blood pressure is taken. Your abdomen will be measured to track your baby's growth. The fetal heartbeat will be listened to. Any test results from the previous visit will be discussed. You may have a cervical check near your due date to see if you have effaced. At around 36 weeks, your caregiver will check your cervix. At the same time, your  caregiver will also perform a test on the secretions of the vaginal tissue. This test is to determine if a type of bacteria, Group B streptococcus, is present. Your caregiver will explain this further. Your caregiver may ask you: What your birth plan is. How you are feeling. If you are feeling the baby move. If you have had any abnormal symptoms, such as leaking fluid, bleeding, severe headaches, or abdominal cramping. If you have any questions. Other tests or screenings that may be performed during your third trimester include: Blood tests that check for low iron levels (anemia). Fetal testing to check the health, activity level, and growth of the fetus. Testing is done if you have certain medical conditions or if there are problems during the pregnancy. FALSE LABOR You may feel small, irregular contractions that eventually go away. These are called Braxton Hicks contractions, or false labor. Contractions may last for hours, days, or even weeks before true labor sets in. If contractions come at regular intervals, intensify, or become painful, it is best to be seen by your caregiver.  SIGNS OF LABOR  Menstrual-like cramps. Contractions that are 5 minutes apart or less. Contractions that start on the top of the uterus and spread down to the lower abdomen and back. A sense of increased pelvic pressure or back pain. A watery or bloody mucus discharge that comes from the vagina. If you have any of these signs before the 37th week of pregnancy, call your caregiver right away. You need to go to the hospital to get checked immediately. HOME CARE INSTRUCTIONS  Avoid all smoking, herbs, alcohol, and unprescribed drugs. These chemicals affect the formation and growth of the baby. Follow your caregiver's instructions regarding medicine use. There are medicines that are either safe or unsafe to take during pregnancy. Exercise only as directed by your caregiver. Experiencing uterine cramps is a good sign to  stop exercising. Continue to eat regular, healthy meals. Wear a good support bra for breast tenderness. Do not use hot tubs, steam rooms, or saunas. Wear your seat belt at all times when driving. Avoid raw meat, uncooked cheese, cat litter boxes, and soil used by cats. These carry germs that can cause birth defects in the baby. Take your prenatal vitamins. Try taking a stool softener (if your caregiver approves) if you develop constipation. Eat more high-fiber foods, such as fresh vegetables or fruit and whole grains. Drink plenty of fluids to keep your urine clear or pale yellow. Take warm sitz baths to soothe any pain or discomfort caused by hemorrhoids. Use hemorrhoid cream if   your caregiver approves. If you develop varicose veins, wear support hose. Elevate your feet for 15 minutes, 3-4 times a day. Limit salt in your diet. Avoid heavy lifting, wear low heal shoes, and practice good posture. Rest a lot with your legs elevated if you have leg cramps or low back pain. Visit your dentist if you have not gone during your pregnancy. Use a soft toothbrush to brush your teeth and be gentle when you floss. A sexual relationship may be continued unless your caregiver directs you otherwise. Do not travel far distances unless it is absolutely necessary and only with the approval of your caregiver. Take prenatal classes to understand, practice, and ask questions about the labor and delivery. Make a trial run to the hospital. Pack your hospital bag. Prepare the baby's nursery. Continue to go to all your prenatal visits as directed by your caregiver. SEEK MEDICAL CARE IF: You are unsure if you are in labor or if your water has broken. You have dizziness. You have mild pelvic cramps, pelvic pressure, or nagging pain in your abdominal area. You have persistent nausea, vomiting, or diarrhea. You have a bad smelling vaginal discharge. You have pain with urination. SEEK IMMEDIATE MEDICAL CARE IF:  You  have a fever. You are leaking fluid from your vagina. You have spotting or bleeding from your vagina. You have severe abdominal cramping or pain. You have rapid weight loss or gain. You have shortness of breath with chest pain. You notice sudden or extreme swelling of your face, hands, ankles, feet, or legs. You have not felt your baby move in over an hour. You have severe headaches that do not go away with medicine. You have vision changes. Document Released: 11/12/2001 Document Revised: 11/23/2013 Document Reviewed: 01/19/2013 ExitCare Patient Information 2015 [employer]. This information is not intended to replace advice given to you by your health care provider. Make sure you discuss any questions you have with your health care provider.       

## 2021-08-14 NOTE — Progress Notes (Signed)
LOW-RISK PREGNANCY VISIT Patient name: Ashley David MRN [id number]  Date of birth: May 06, 1986 Chief Complaint:   Routine Prenatal Visit (Still having heartburn, medication not helping)  History of Present Illness:   Ashley David is a 35 y.o. G43P2002 female at [redacted]w[redacted]d with an Estimated Date of Delivery: 11/02/21 being seen today for ongoing management of a low-risk pregnancy.   Today she reports  heartburn is bad, protonix helps a little, but not much. Still contemplating BTL vs Nexplanon .  Nausea, takes phenergan at least TID. Contractions: Not present. Vag. Bleeding: None.  Movement: Present. denies leaking of fluid.  Depression screen [facility] 2/9 08/14/2021 05/16/2021 09/04/2018 09/04/2018  Decreased Interest 2 1 2 2   Down, Depressed, Hopeless 2 1 2 2   PHQ - 2 Score 4 2 4 4   Altered sleeping 1 2 3  -  Tired, decreased energy 1 3 3  -  Change in appetite 2 0 1 -  Feeling bad or failure about yourself  0 1 1 -  Trouble concentrating 1 0 2 -  Moving slowly or fidgety/restless 0 0 0 -  Suicidal thoughts 0 0 1 -  PHQ-9 Score 9 8 15  -  Difficult doing work/chores - - Very difficult -     GAD 7 : Generalized Anxiety Score 08/14/2021 05/16/2021  Nervous, Anxious, on Edge 1 1  Control/stop worrying 1 1  Worry too much - different things 1 1  Trouble relaxing 1 1  Restless 1 0  Easily annoyed or irritable 3 2  Afraid - awful might happen 0 0  Total GAD 7 Score 8 6      Review of Systems:   Pertinent items are noted in HPI Denies abnormal vaginal discharge w/ itching/odor/irritation, headaches, visual changes, shortness of breath, chest pain, abdominal pain, severe nausea/vomiting, or problems with urination or bowel movements unless otherwise stated above. Pertinent History Reviewed:  Reviewed past medical,surgical, social, obstetrical and family history.  Reviewed problem list, medications and allergies. Physical Assessment:   Vitals:   08/14/21 0850  BP: 123/62  Pulse: 91  Weight:  157 lb (71.2 kg)  Body mass index is 29.66 kg/m.        Physical Examination:   General appearance: Well appearing, and in no distress  Mental status: Alert, oriented to person, place, and time  Skin: Warm & dry  Cardiovascular: Normal heart rate noted  Respiratory: Normal respiratory effort, no distress  Abdomen: Soft, gravid, nontender  Pelvic: Cervical exam deferred         Extremities: Edema: None  Fetal Status: Fetal Heart Rate (bpm): 152 Fundal Height: 27 cm Movement: Present    Chaperone: N/A   No results found for this or any previous visit (from the past 24 hour(s)).  Assessment & Plan:  1) Low-risk pregnancy G3P2002 at [redacted]w[redacted]d with an Estimated Date of Delivery: 11/02/21   2) May want BTL, reviewed risks/benefits, discussed high incidence regret <30yo if appropriate, LARCs just as effective, consent signed today   3) Heartburn> increase protonix to 20mg  BID  4) Nausea> rx diclegis   Meds:  Meds ordered this encounter  Medications   pantoprazole (PROTONIX) 20 MG tablet    Sig: Take 1 tablet (20 mg total) by mouth 2 (two) times daily.    Dispense:  60 tablet    Refill:  6    Order Specific Question:   Supervising Provider    Answer:   H [[id number]]   Doxylamine-Pyridoxine (DICLEGIS) 10-10 MG TBEC  Sig: 2 tabs q hs, if sx persist add 1 tab q am on day 3, if sx persist add 1 tab q afternoon on day 4    Dispense:  100 tablet    Refill:  6    Order Specific Question:   Supervising Provider    Answer:   Lazaro Arms [[id number]]   Labs/procedures today: tdap, PN2, and declined flu shot  Plan:  Continue routine obstetrical care  Next visit: prefers in person    Reviewed: Preterm labor symptoms and general obstetric precautions including but not limited to vaginal bleeding, contractions, leaking of fluid and fetal movement were reviewed in detail with the patient.  All questions were answered.   Follow-up: Return in about 4 weeks (around 09/11/2021) for LROB,  CNM, in person, Sign BTL consent today.  No future appointments.  No orders of the defined types were placed in this encounter.  Cheral Marker CNM, [facility] 08/14/2021 9:19 AM

## 2021-08-15 LAB — GLUCOSE TOLERANCE, 2 HOURS W/ 1HR
Glucose, 1 hour: 146 mg/dL (ref 65–179)
Glucose, 2 hour: 117 mg/dL (ref 65–152)
Glucose, Fasting: 84 mg/dL (ref 65–91)

## 2021-08-15 LAB — HIV ANTIBODY (ROUTINE TESTING W REFLEX): HIV Screen 4th Generation wRfx: NONREACTIVE

## 2021-08-15 LAB — CBC
Hematocrit: 28.4 % — ABNORMAL LOW (ref 34.0–46.6)
Hemoglobin: 9.6 g/dL — ABNORMAL LOW (ref 11.1–15.9)
MCH: 31.3 pg (ref 26.6–33.0)
MCHC: 33.8 g/dL (ref 31.5–35.7)
MCV: 93 fL (ref 79–97)
Platelets: 357 10*3/uL (ref 150–450)
RBC: 3.07 x10E6/uL — ABNORMAL LOW (ref 3.77–5.28)
RDW: 12.7 % (ref 11.7–15.4)
WBC: 11.7 10*3/uL — ABNORMAL HIGH (ref 3.4–10.8)

## 2021-08-15 LAB — ANTIBODY SCREEN: Antibody Screen: NEGATIVE

## 2021-08-15 LAB — RPR: RPR Ser Ql: NONREACTIVE

## 2021-09-11 ENCOUNTER — Encounter: Payer: Self-pay | Admitting: Women's Health

## 2021-09-11 ENCOUNTER — Ambulatory Visit (INDEPENDENT_AMBULATORY_CARE_PROVIDER_SITE_OTHER): Payer: Medicaid Other | Admitting: Women's Health

## 2021-09-11 ENCOUNTER — Other Ambulatory Visit: Payer: Self-pay

## 2021-09-11 VITALS — BP 130/80 | HR 97 | Wt 161.0 lb

## 2021-09-11 DIAGNOSIS — Z3483 Encounter for supervision of other normal pregnancy, third trimester: Secondary | ICD-10-CM

## 2021-09-11 DIAGNOSIS — Z3A32 32 weeks gestation of pregnancy: Secondary | ICD-10-CM

## 2021-09-11 NOTE — Progress Notes (Signed)
LOW-RISK PREGNANCY VISIT Patient name: Ashley David MRN [id number]  Date of birth: 02-08-1986 Chief Complaint:   Routine Prenatal Visit  History of Present Illness:   Ashley David is a 35 y.o. G72P2002 female at [redacted]w[redacted]d with an Estimated Date of Delivery: 11/02/21 being seen today for ongoing management of a low-risk pregnancy.   Today she reports  pressure. Heartburn much better taking protonix bid. Stopped zoloft, felt baby had decreased fm when taking-doing ok off, plans to restart as soon as baby born. Baby likes to stay on Rt side into ribs, can get uncomfortable . Contractions: Not present.  .  Movement: Present. denies leaking of fluid.  Depression screen [facility] 2/9 08/14/2021 05/16/2021 09/04/2018 09/04/2018  Decreased Interest 2 1 2 2   Down, Depressed, Hopeless 2 1 2 2   PHQ - 2 Score 4 2 4 4   Altered sleeping 1 2 3  -  Tired, decreased energy 1 3 3  -  Change in appetite 2 0 1 -  Feeling bad or failure about yourself  0 1 1 -  Trouble concentrating 1 0 2 -  Moving slowly or fidgety/restless 0 0 0 -  Suicidal thoughts 0 0 1 -  PHQ-9 Score 9 8 15  -  Difficult doing work/chores - - Very difficult -     GAD 7 : Generalized Anxiety Score 08/14/2021 05/16/2021  Nervous, Anxious, on Edge 1 1  Control/stop worrying 1 1  Worry too much - different things 1 1  Trouble relaxing 1 1  Restless 1 0  Easily annoyed or irritable 3 2  Afraid - awful might happen 0 0  Total GAD 7 Score 8 6      Review of Systems:   Pertinent items are noted in HPI Denies abnormal vaginal discharge w/ itching/odor/irritation, headaches, visual changes, shortness of breath, chest pain, abdominal pain, severe nausea/vomiting, or problems with urination or bowel movements unless otherwise stated above. Pertinent History Reviewed:  Reviewed past medical,surgical, social, obstetrical and family history.  Reviewed problem list, medications and allergies. Physical Assessment:   Vitals:   09/11/21 0857  BP:  130/80  Pulse: 97  Weight: 161 lb (73 kg)  Body mass index is 30.42 kg/m.        Physical Examination:   General appearance: Well appearing, and in no distress  Mental status: Alert, oriented to person, place, and time  Skin: Warm & dry  Cardiovascular: Normal heart rate noted  Respiratory: Normal respiratory effort, no distress  Abdomen: Soft, gravid, nontender  Pelvic: Cervical exam deferred         Extremities: Edema: None  Fetal Status: Fetal Heart Rate (bpm): 152 Fundal Height: 30 cm Movement: Present    Chaperone: N/A   No results found for this or any previous visit (from the past 24 hour(s)).  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 Doing ok off, plans to restart as soon as baby born   Meds: No orders of the defined types were placed in this encounter.  Labs/procedures today: none  Plan:  Continue routine obstetrical care  Next visit: prefers in person    Reviewed: Preterm labor symptoms and general obstetric precautions including but not limited to vaginal bleeding, contractions, leaking of fluid and fetal movement were reviewed in detail with the patient.  All questions were answered.  Follow-up: Return in about 2 weeks (around  09/25/2021) for LROB, CNM, in person.  No future appointments.  No orders of the defined types were placed in this encounter.  Cheral Marker CNM, [facility] 09/11/2021 9:31 AM

## 2021-09-11 NOTE — Patient Instructions (Signed)
Ashley David, thank you for choosing our office today! We appreciate the opportunity to meet your healthcare needs. You may receive a short survey by mail, e-mail, or through [employer]. If you are happy with your care we would appreciate if you could take just a few minutes to complete the survey questions. We read all of your comments and take your feedback very seriously. Thank you again for choosing our office.  [facility] [employer] Team at [facility]  [facility] ([address] [place], [place] [address]) Entrance C, located off of [address] Free 24/7 valet parking   CLASSES: Go to [email or web address] to register for classes (childbirth, breastfeeding, waterbirth, infant CPR, daddy bootcamp, etc.)  Call the office [phone]) or go to [facility] if: You begin to have strong, frequent contractions Your water breaks.  Sometimes it is a big gush of fluid, sometimes it is just a trickle that keeps getting your panties wet or running down your legs You have vaginal bleeding.  It is normal to have a small amount of spotting if your cervix was checked.  You don't feel your baby moving like normal.  If you don't, get you something to eat and drink and lay down and focus on feeling your baby move.   If your baby is still not moving like normal, you should call the office or go to [facility].  Call the office [phone]) or go to [facility] for these signs of pre-eclampsia: Severe headache that does not go away with Tylenol Visual changes- seeing spots, double, blurred vision Pain under your right breast or upper abdomen that does not go away with Tums or heartburn medicine Nausea and/or vomiting Severe swelling in your hands, feet, and face   Tdap Vaccine It is recommended that you get the Tdap vaccine during the third trimester of EACH pregnancy to help protect your baby from getting pertussis (whooping cough) 27-36 weeks is the BEST time to do  this so that you can pass the protection on to your baby. During pregnancy is better than after pregnancy, but if you are unable to get it during pregnancy it will be offered at the hospital.  You can get this vaccine with [place], at the health department, your family doctor, or some local pharmacies Everyone who will be around your baby should also be up-to-date on their vaccines before the baby comes. Adults (who are not pregnant) only need 1 dose of Tdap during adulthood.   [facility] Pediatricians/Family Doctors [facility]): [address]. Colette Ribas, [phone]           [facility]: [address], [phone]                [facility] Medicine [facility]): [address], [phone] (call to ask if accepting patients) [facility]: [address], [place], [phone]    [facility] Pediatricians/Family Doctors [facility]): [phone], [phone] [facility]: [address] [place], [phone] [facility]: [address], [phone]  [facility] Doctors  [facility]): [phone] [facility]: [address] [phone]   [facility] Doctors [facility]: [address] [phone]  [facility] Family Doctors  [facility]: [phone], [phone]  Home Blood Pressure Monitoring for Patients   Your provider has recommended that you check your  blood pressure (BP) at least once a week at home. If you do not have a blood pressure cuff at home, one will be provided for you. Contact your provider if you have not received your monitor within 1 week.   Helpful Tips for Accurate Home Blood Pressure Checks  Don't smoke, exercise, or drink caffeine 30 minutes before checking your BP Use the restroom before checking your BP (a full bladder can raise your  pressure) Relax in a comfortable upright chair Feet on the ground Left arm resting comfortably on a flat surface at the level of your heart Legs uncrossed Back supported Sit quietly and don't talk Place the cuff on your bare arm Adjust snuggly, so that only two fingertips can fit between your skin and the top of the cuff Check 2 readings separated by at least one minute Keep a log of your BP readings For a visual, please reference this diagram: http://ccnc.care/bpdiagram  Provider Name: [facility]     Phone: [phone]  Zone 1: ALL CLEAR  Continue to monitor your symptoms:  BP reading is less than 140 (top number) or less than 90 (bottom number)  No right upper stomach pain No headaches or seeing spots No feeling nauseated or throwing up No swelling in face and hands  Zone 2: CAUTION Call your doctor's office for any of the following:  BP reading is greater than 140 (top number) or greater than 90 (bottom number)  Stomach pain under your ribs in the middle or right side Headaches or seeing spots Feeling nauseated or throwing up Swelling in face and hands  Zone 3: EMERGENCY  Seek immediate medical care if you have any of the following:  BP reading is greater than160 (top number) or greater than 110 (bottom number) Severe headaches not improving with Tylenol Serious difficulty catching your breath Any worsening symptoms from Zone 2  Preterm Labor and Birth Information  The normal length of a pregnancy is 39-41 weeks. Preterm labor is when labor starts before 37 completed weeks of pregnancy. What are the risk factors for preterm labor? Preterm labor is more likely to occur in women who: Have certain infections during pregnancy such as a bladder infection, sexually transmitted infection, or infection inside the uterus (chorioamnionitis). Have a shorter-than-normal cervix. Have gone into preterm labor before. Have had surgery on their cervix. Are younger than age 17  or older than age 35. Are African American. Are pregnant with twins or multiple babies (multiple gestation). Take street drugs or smoke while pregnant. Do not gain enough weight while pregnant. Became pregnant shortly after having been pregnant. What are the symptoms of preterm labor? Symptoms of preterm labor include: Cramps similar to those that can happen during a menstrual period. The cramps may happen with diarrhea. Pain in the abdomen or lower back. Regular uterine contractions that may feel like tightening of the abdomen. A feeling of increased pressure in the pelvis. Increased watery or bloody mucus discharge from the vagina. Water breaking (ruptured amniotic sac). Why is it important to recognize signs of preterm labor? It is important to recognize signs of preterm labor because babies who are born prematurely may not be fully developed. This can put them at an increased risk for: Long-term (chronic) heart and lung problems. Difficulty immediately after birth with regulating body systems, including blood sugar, body temperature, heart rate, and breathing rate. Bleeding in the brain. Cerebral palsy. Learning difficulties. Death. These risks are highest for babies who are born before 34 weeks   of pregnancy. How is preterm labor treated? Treatment depends on the length of your pregnancy, your condition, and the health of your baby. It may involve: Having a stitch (suture) placed in your cervix to prevent your cervix from opening too early (cerclage). Taking or being given medicines, such as: Hormone medicines. These may be given early in pregnancy to help support the pregnancy. Medicine to stop contractions. Medicines to help mature the baby's lungs. These may be prescribed if the risk of delivery is high. Medicines to prevent your baby from developing cerebral palsy. If the labor happens before 34 weeks of pregnancy, you may need to stay in the hospital. What should I do if I  think I am in preterm labor? If you think that you are going into preterm labor, call your health care provider right away. How can I prevent preterm labor in future pregnancies? To increase your chance of having a full-term pregnancy: Do not use any tobacco products, such as cigarettes, chewing tobacco, and e-cigarettes. If you need help quitting, ask your health care provider. Do not use street drugs or medicines that have not been prescribed to you during your pregnancy. Talk with your health care provider before taking any herbal supplements, even if you have been taking them regularly. Make sure you gain a healthy amount of weight during your pregnancy. Watch for infection. If you think that you might have an infection, get it checked right away. Make sure to tell your health care provider if you have gone into preterm labor before. This information is not intended to replace advice given to you by your health care provider. Make sure you discuss any questions you have with your health care provider. Document Revised: 03/12/2019 Document Reviewed: 04/10/2016 Elsevier Patient Education  2020 Elsevier Inc.   

## 2021-09-25 ENCOUNTER — Other Ambulatory Visit: Payer: Self-pay

## 2021-09-25 ENCOUNTER — Ambulatory Visit (INDEPENDENT_AMBULATORY_CARE_PROVIDER_SITE_OTHER): Payer: Medicaid Other | Admitting: Women's Health

## 2021-09-25 ENCOUNTER — Encounter: Payer: Self-pay | Admitting: Women's Health

## 2021-09-25 VITALS — BP 124/74 | HR 90 | Wt 163.5 lb

## 2021-09-25 DIAGNOSIS — Z3483 Encounter for supervision of other normal pregnancy, third trimester: Secondary | ICD-10-CM

## 2021-09-25 DIAGNOSIS — Z3480 Encounter for supervision of other normal pregnancy, unspecified trimester: Secondary | ICD-10-CM

## 2021-09-25 NOTE — Progress Notes (Signed)
LOW-RISK PREGNANCY VISIT Patient name: Ashley David MRN [id number]  Date of birth: 1986-06-29 Chief Complaint:   Routine Prenatal Visit  History of Present Illness:   Ashley David is a 35 y.o. G63P2002 female at [redacted]w[redacted]d with an Estimated Date of Delivery: 11/02/21 being seen today for ongoing management of a low-risk pregnancy.   Today she reports  some occasional contractions, pressure . Contractions: Irritability. Vag. Bleeding: None.  Movement: Present. denies leaking of fluid.  Depression screen [facility] 2/9 08/14/2021 05/16/2021 09/04/2018 09/04/2018  Decreased Interest 2 1 2 2   Down, Depressed, Hopeless 2 1 2 2   PHQ - 2 Score 4 2 4 4   Altered sleeping 1 2 3  -  Tired, decreased energy 1 3 3  -  Change in appetite 2 0 1 -  Feeling bad or failure about yourself  0 1 1 -  Trouble concentrating 1 0 2 -  Moving slowly or fidgety/restless 0 0 0 -  Suicidal thoughts 0 0 1 -  PHQ-9 Score 9 8 15  -  Difficult doing work/chores - - Very difficult -     GAD 7 : Generalized Anxiety Score 08/14/2021 05/16/2021  Nervous, Anxious, on Edge 1 1  Control/stop worrying 1 1  Worry too much - different things 1 1  Trouble relaxing 1 1  Restless 1 0  Easily annoyed or irritable 3 2  Afraid - awful might happen 0 0  Total GAD 7 Score 8 6      Review of Systems:   Pertinent items are noted in HPI Denies abnormal vaginal discharge w/ itching/odor/irritation, headaches, visual changes, shortness of breath, chest pain, abdominal pain, severe nausea/vomiting, or problems with urination or bowel movements unless otherwise stated above. Pertinent History Reviewed:  Reviewed past medical,surgical, social, obstetrical and family history.  Reviewed problem list, medications and allergies. Physical Assessment:   Vitals:   09/25/21 0901  BP: 124/74  Pulse: 90  Weight: 163 lb 8 oz (74.2 kg)  Body mass index is 30.89 kg/m.        Physical Examination:   General appearance: Well appearing, and in no  distress  Mental status: Alert, oriented to person, place, and time  Skin: Warm & dry  Cardiovascular: Normal heart rate noted  Respiratory: Normal respiratory effort, no distress  Abdomen: Soft, gravid, nontender  Pelvic: Cervical exam deferred         Extremities: Edema: Trace  Fetal Status: Fetal Heart Rate (bpm): 131 Fundal Height: 34 cm Movement: Present    Chaperone: N/A   No results found for this or any previous visit (from the past 24 hour(s)).  Assessment & Plan:  1) Low-risk pregnancy G3P2002 at [redacted]w[redacted]d with an Estimated Date of Delivery: 11/02/21    Meds: No orders of the defined types were placed in this encounter.  Labs/procedures today: none  Plan:  Continue routine obstetrical care  Next visit: prefers will be in person for cultures     Reviewed: Preterm labor symptoms and general obstetric precautions including but not limited to vaginal bleeding, contractions, leaking of fluid and fetal movement were reviewed in detail with the patient.  All questions were answered.   Follow-up: Return in about 2 weeks (around 10/09/2021) for LROB, CNM, in person.  Future Appointments  Date Time Provider Department Center  10/09/2021  9:10 AM 05/18/2021, CNM [facility]    No orders of the defined types were placed in this encounter.  09/27/21 CNM, [facility] 09/25/2021 9:19 AM

## 2021-09-25 NOTE — Patient Instructions (Signed)
Ashley David, thank you for choosing our office today! We appreciate the opportunity to meet your healthcare needs. You may receive a short survey by mail, e-mail, or through [employer]. If you are happy with your care we would appreciate if you could take just a few minutes to complete the survey questions. We read all of your comments and take your feedback very seriously. Thank you again for choosing our office.  [facility] for [employer] Team at [facility]  [facility] ([address] [place], [place] [address]) Entrance C, located off of [address] Free 24/7 valet parking   CLASSES: Go to [email or web address] to register for classes (childbirth, breastfeeding, waterbirth, infant CPR, daddy bootcamp, etc.)  Call the office [phone]) or go to [facility] if: You begin to have strong, frequent contractions Your water breaks.  Sometimes it is a big gush of fluid, sometimes it is just a trickle that keeps getting your panties wet or running down your legs You have vaginal bleeding.  It is normal to have a small amount of spotting if your cervix was checked.  You don't feel your baby moving like normal.  If you don't, get you something to eat and drink and lay down and focus on feeling your baby move.   If your baby is still not moving like normal, you should call the office or go to [facility].  Call the office [phone]) or go to [facility] for these signs of pre-eclampsia: Severe headache that does not go away with Tylenol Visual changes- seeing spots, double, blurred vision Pain under your right breast or upper abdomen that does not go away with Tums or heartburn medicine Nausea and/or vomiting Severe swelling in your hands, feet, and face   Tdap Vaccine It is recommended that you get the Tdap vaccine during the third trimester of EACH pregnancy to help protect your baby from getting pertussis (whooping cough) 27-36 weeks is the BEST time to do  this so that you can pass the protection on to your baby. During pregnancy is better than after pregnancy, but if you are unable to get it during pregnancy it will be offered at the hospital.  You can get this vaccine with [place], at the health department, your family doctor, or some local pharmacies Everyone who will be around your baby should also be up-to-date on their vaccines before the baby comes. Adults (who are not pregnant) only need 1 dose of Tdap during adulthood.   [facility] Pediatricians/Family Doctors [facility]): [address] Colette Ribas, [phone]           [facility]: [address], [phone]                [facility]): [address], [phone] (call to ask if accepting patients) [facility] Department: [address], [place], [phone]    [facility] Pediatricians/Family Doctors [facility]): [phone] [address], [phone] [facility]: [address] [place], [phone] [facility]: [address], [phone]  [facility] Doctors  [facility]): [phone] [facility]: [address] [phone]   [facility] Doctors [facility]: [address] [phone]  [facility] Family Doctors  [facility]: [phone], [phone]  Home Blood Pressure Monitoring for Patients   Your provider has recommended that you check your  blood pressure (BP) at least once a week at home. If you do not have a blood pressure cuff at home, one will be provided for you. Contact your provider if you have not received your monitor within 1 week.   Helpful Tips for Accurate Home Blood Pressure Checks  Don't smoke, exercise, or drink caffeine 30 minutes before checking your BP Use the restroom before checking your BP (a full bladder can raise your  pressure) Relax in a comfortable upright chair Feet on the ground Left arm resting comfortably on a flat surface at the level of your heart Legs uncrossed Back supported Sit quietly and don't talk Place the cuff on your bare arm Adjust snuggly, so that only two fingertips can fit between your skin and the top of the cuff Check 2 readings separated by at least one minute Keep a log of your BP readings For a visual, please reference this diagram: http://ccnc.care/bpdiagram  Provider Name: [facility]     Phone: [phone]  Zone 1: ALL CLEAR  Continue to monitor your symptoms:  BP reading is less than 140 (top number) or less than 90 (bottom number)  No right upper stomach pain No headaches or seeing spots No feeling nauseated or throwing up No swelling in face and hands  Zone 2: CAUTION Call your doctor's office for any of the following:  BP reading is greater than 140 (top number) or greater than 90 (bottom number)  Stomach pain under your ribs in the middle or right side Headaches or seeing spots Feeling nauseated or throwing up Swelling in face and hands  Zone 3: EMERGENCY  Seek immediate medical care if you have any of the following:  BP reading is greater than160 (top number) or greater than 110 (bottom number) Severe headaches not improving with Tylenol Serious difficulty catching your breath Any worsening symptoms from Zone 2  Preterm Labor and Birth Information  The normal length of a pregnancy is 39-41 weeks. Preterm labor is when labor starts before 37 completed weeks of pregnancy. What are the risk factors for preterm labor? Preterm labor is more likely to occur in women who: Have certain infections during pregnancy such as a bladder infection, sexually transmitted infection, or infection inside the uterus (chorioamnionitis). Have a shorter-than-normal cervix. Have gone into preterm labor before. Have had surgery on their cervix. Are younger than age 17  or older than age 35. Are African American. Are pregnant with twins or multiple babies (multiple gestation). Take street drugs or smoke while pregnant. Do not gain enough weight while pregnant. Became pregnant shortly after having been pregnant. What are the symptoms of preterm labor? Symptoms of preterm labor include: Cramps similar to those that can happen during a menstrual period. The cramps may happen with diarrhea. Pain in the abdomen or lower back. Regular uterine contractions that may feel like tightening of the abdomen. A feeling of increased pressure in the pelvis. Increased watery or bloody mucus discharge from the vagina. Water breaking (ruptured amniotic sac). Why is it important to recognize signs of preterm labor? It is important to recognize signs of preterm labor because babies who are born prematurely may not be fully developed. This can put them at an increased risk for: Long-term (chronic) heart and lung problems. Difficulty immediately after birth with regulating body systems, including blood sugar, body temperature, heart rate, and breathing rate. Bleeding in the brain. Cerebral palsy. Learning difficulties. Death. These risks are highest for babies who are born before 34 weeks   of pregnancy. How is preterm labor treated? Treatment depends on the length of your pregnancy, your condition, and the health of your baby. It may involve: Having a stitch (suture) placed in your cervix to prevent your cervix from opening too early (cerclage). Taking or being given medicines, such as: Hormone medicines. These may be given early in pregnancy to help support the pregnancy. Medicine to stop contractions. Medicines to help mature the baby's lungs. These may be prescribed if the risk of delivery is high. Medicines to prevent your baby from developing cerebral palsy. If the labor happens before 34 weeks of pregnancy, you may need to stay in the hospital. What should I do if I  think I am in preterm labor? If you think that you are going into preterm labor, call your health care provider right away. How can I prevent preterm labor in future pregnancies? To increase your chance of having a full-term pregnancy: Do not use any tobacco products, such as cigarettes, chewing tobacco, and e-cigarettes. If you need help quitting, ask your health care provider. Do not use street drugs or medicines that have not been prescribed to you during your pregnancy. Talk with your health care provider before taking any herbal supplements, even if you have been taking them regularly. Make sure you gain a healthy amount of weight during your pregnancy. Watch for infection. If you think that you might have an infection, get it checked right away. Make sure to tell your health care provider if you have gone into preterm labor before. This information is not intended to replace advice given to you by your health care provider. Make sure you discuss any questions you have with your health care provider. Document Revised: 03/12/2019 Document Reviewed: 04/10/2016 Elsevier Patient Education  2020 Elsevier Inc.   

## 2021-10-09 ENCOUNTER — Encounter: Payer: Self-pay | Admitting: Women's Health

## 2021-10-09 ENCOUNTER — Other Ambulatory Visit (HOSPITAL_COMMUNITY)
Admission: RE | Admit: 2021-10-09 | Discharge: 2021-10-09 | Disposition: A | Discharge status: Home / Self Care | Payer: Medicaid Other | Source: Ambulatory Visit | Attending: Women's Health | Admitting: Women's Health

## 2021-10-09 ENCOUNTER — Ambulatory Visit (INDEPENDENT_AMBULATORY_CARE_PROVIDER_SITE_OTHER): Payer: Medicaid Other | Admitting: Women's Health

## 2021-10-09 ENCOUNTER — Other Ambulatory Visit: Payer: Self-pay

## 2021-10-09 VITALS — BP 143/89 | HR 89 | Wt 163.0 lb

## 2021-10-09 DIAGNOSIS — Z3480 Encounter for supervision of other normal pregnancy, unspecified trimester: Secondary | ICD-10-CM | POA: Insufficient documentation

## 2021-10-09 DIAGNOSIS — R030 Elevated blood-pressure reading, without diagnosis of hypertension: Secondary | ICD-10-CM

## 2021-10-09 DIAGNOSIS — Z3A36 36 weeks gestation of pregnancy: Secondary | ICD-10-CM

## 2021-10-09 DIAGNOSIS — Z3483 Encounter for supervision of other normal pregnancy, third trimester: Secondary | ICD-10-CM

## 2021-10-09 LAB — POCT URINALYSIS DIPSTICK OB
Blood, UA: NEGATIVE
Glucose, UA: NEGATIVE
Ketones, UA: NEGATIVE
Leukocytes, UA: NEGATIVE
Nitrite, UA: NEGATIVE

## 2021-10-09 NOTE — Progress Notes (Signed)
LOW-RISK PREGNANCY VISIT Patient name: Ashley David MRN [id number]  Date of birth: [birth year] Chief Complaint:   Routine Prenatal Visit  History of Present Illness:   Ashley David is a 35 y.o. G67P2002 female at [redacted]w[redacted]d with an Estimated Date of Delivery: 11/02/21 being seen today for ongoing management of a low-risk pregnancy.   Today she reports  occ mild headache, nothing she takes meds for. Sees black spots occasionally, when changing positions, standing at work ([employer]), no ruq/epigastric pain, n/v . Contractions: Irritability. Vag. Bleeding: None.  Movement: Present. denies leaking of fluid.  Depression screen [facility] 2/9 08/14/2021 05/16/2021 09/04/2018 09/04/2018  Decreased Interest 2 1 2 2   Down, Depressed, Hopeless 2 1 2 2   PHQ - 2 Score 4 2 4 4   Altered sleeping 1 2 3  -  Tired, decreased energy 1 3 3  -  Change in appetite 2 0 1 -  Feeling bad or failure about yourself  0 1 1 -  Trouble concentrating 1 0 2 -  Moving slowly or fidgety/restless 0 0 0 -  Suicidal thoughts 0 0 1 -  PHQ-9 Score 9 8 15  -  Difficult doing work/chores - - Very difficult -     GAD 7 : Generalized Anxiety Score 08/14/2021 05/16/2021  Nervous, Anxious, on Edge 1 1  Control/stop worrying 1 1  Worry too much - different things 1 1  Trouble relaxing 1 1  Restless 1 0  Easily annoyed or irritable 3 2  Afraid - awful might happen 0 0  Total GAD 7 Score 8 6      Review of Systems:   Pertinent items are noted in HPI Denies abnormal vaginal discharge w/ itching/odor/irritation, headaches, visual changes, shortness of breath, chest pain, abdominal pain, severe nausea/vomiting, or problems with urination or bowel movements unless otherwise stated above. Pertinent History Reviewed:  Reviewed past medical,surgical, social, obstetrical and family history.  Reviewed problem list, medications and allergies. Physical Assessment:   Vitals:   10/09/21 0921  BP: (!) 143/89  Pulse: 89  Weight: 163 lb (73.9  kg)  Body mass index is 30.8 kg/m.        Physical Examination:   General appearance: Well appearing, and in no distress  Mental status: Alert, oriented to person, place, and time  Skin: Warm & dry  Cardiovascular: Normal heart rate noted  Respiratory: Normal respiratory effort, no distress  Abdomen: Soft, gravid, nontender  Pelvic: Cervical exam performed  Dilation: 2.5 Effacement (%): Thick Station: Ballotable  Extremities: Edema: Trace  Fetal Status: Fetal Heart Rate (bpm): 143 Fundal Height: 34 cm Movement: Present Presentation: Complete Breech  Informal u/s: breech, vtx LUQ  Chaperone: Alice Rieger   Results for orders placed or performed in visit on 10/09/21 (from the past 24 hour(s))  POC Urinalysis Dipstick OB   Collection Time: 10/09/21  9:31 AM  Result Value Ref Range   Color, UA     Clarity, UA     Glucose, UA Negative Negative   Bilirubin, UA     Ketones, UA neg    Spec Grav, UA     Blood, UA neg    pH, UA     POC,PROTEIN,UA Small (1+) Negative, Trace, Small (1+), Moderate (2+), Large (3+), 4+   Urobilinogen, UA     Nitrite, UA neg    Leukocytes, UA Negative Negative   Appearance     Odor      Assessment & Plan:  1) Low-risk pregnancy G3P2002 at [redacted]w[redacted]d with  an Estimated Date of Delivery: 11/02/21   2) Elevated bp, w/ 1+ proteinuria, occ mild headache/spots. Reviewed pre-e s/s, reasons to seek care. Check bp BID. F/U 1d for bp check w/ nurse, if elevated deliver @ 37wk  3) Breech> vtx LUQ, discussed ECV vs PCS, wants ECV, will wait til tomorrow after bp check to schedule   Meds: No orders of the defined types were placed in this encounter.  Labs/procedures today: GBS, GC/CT, and SVE  Plan:  Continue routine obstetrical care  Next visit: prefers in person    Reviewed: Preterm labor symptoms and general obstetric precautions including but not limited to vaginal bleeding, contractions, leaking of fluid and fetal movement were reviewed in detail with the  patient.  All questions were answered. Does have home bp cuff. Office bp cuff given: not applicable. Check bp twice daily, let us know if consistently >160 and/or >110.  Follow-up: Return in about 1 day (around 10/10/2021) for nurse bp check.  Future Appointments  Date Time Provider Department Center  10/10/2021 11:10 AM [facility] NURSE [facility]    Orders Placed This Encounter  Procedures   Strep Gp B NAA+Rflx   POC Urinalysis Dipstick OB   Cheral Marker CNM, [facility] 10/09/2021 9:56 AM

## 2021-10-09 NOTE — Patient Instructions (Signed)
[name], thank you for choosing our office today! We appreciate the opportunity to meet your healthcare needs. You may receive a short survey by mail, e-mail, or through [employer]. If you are happy with your care we would appreciate if you could take just a few minutes to complete the survey questions. We read all of your comments and take your feedback very seriously. Thank you again for choosing our office.  Center for [employer] Team at [facility]  [facility] ([address] [place], [place] [address]) Entrance C, located off of [address] Free 24/7 valet parking   CLASSES: Go to [email or web address] to register for classes (childbirth, breastfeeding, waterbirth, infant CPR, daddy bootcamp, etc.)  Call the office [phone]) or go to [facility] if: You begin to have strong, frequent contractions Your water breaks.  Sometimes it is a big gush of fluid, sometimes it is just a trickle that keeps getting your panties wet or running down your legs You have vaginal bleeding.  It is normal to have a small amount of spotting if your cervix was checked.  You don't feel your baby moving like normal.  If you don't, get you something to eat and drink and lay down and focus on feeling your baby move.   If your baby is still not moving like normal, you should call the office or go to [facility].  Call the office [phone]) or go to [facility] for these signs of pre-eclampsia: Severe headache that does not go away with Tylenol Visual changes- seeing spots, double, blurred vision Pain under your right breast or upper abdomen that does not go away with Tums or heartburn medicine Nausea and/or vomiting Severe swelling in your hands, feet, and face   Tdap Vaccine It is recommended that you get the Tdap vaccine during the third trimester of EACH pregnancy to help protect your baby from getting pertussis (whooping cough) 27-36 weeks is the BEST time to do  this so that you can pass the protection on to your baby. During pregnancy is better than after pregnancy, but if you are unable to get it during pregnancy it will be offered at the hospital.  You can get this vaccine with [place], at the health department, your family doctor, or some local pharmacies Everyone who will be around your baby should also be up-to-date on their vaccines before the baby comes. Adults (who are not pregnant) only need 1 dose of Tdap during adulthood.   [facility] Pediatricians/Family Doctors [facility]): [address] Colette Ribas, [phone]           [facility]: [address], [phone]                [facility]): [address], [phone] (call to ask if accepting patients) [facility]: [address], [place], [phone]    [facility] Pediatricians/Family Doctors [facility]): [phone], [phone] [facility]: [address] [place], [phone] [facility]: [address], [phone]  [facility] Doctors  [facility]): [phone] [facility]: [address] [phone]   [facility] Doctors [facility]: [address] [phone]  [facility] Family Doctors  [facility]: [phone], [phone]  Home Blood Pressure Monitoring for Patients   Your provider has recommended that you check your  blood pressure (BP) at least once a week at home. If you do not have a blood pressure cuff at home, one will be provided for you. Contact your provider if you have not received your monitor within 1 week.   Helpful Tips for Accurate Home Blood Pressure Checks  Don't smoke, exercise, or drink caffeine 30 minutes before checking your BP Use the restroom before checking your BP (a full bladder can raise your  pressure) Relax in a comfortable upright chair Feet on the ground Left arm resting comfortably on a flat surface at the level of your heart Legs uncrossed Back supported Sit quietly and don't talk Place the cuff on your bare arm Adjust snuggly, so that only two fingertips can fit between your skin and the top of the cuff Check 2 readings separated by at least one minute Keep a log of your BP readings For a visual, please reference this diagram: http://ccnc.care/bpdiagram  Provider Name: [facility]     Phone: [phone]  Zone 1: ALL CLEAR  Continue to monitor your symptoms:  BP reading is less than 140 (top number) or less than 90 (bottom number)  No right upper stomach pain No headaches or seeing spots No feeling nauseated or throwing up No swelling in face and hands  Zone 2: CAUTION Call your doctor's office for any of the following:  BP reading is greater than 140 (top number) or greater than 90 (bottom number)  Stomach pain under your ribs in the middle or right side Headaches or seeing spots Feeling nauseated or throwing up Swelling in face and hands  Zone 3: EMERGENCY  Seek immediate medical care if you have any of the following:  BP reading is greater than160 (top number) or greater than 110 (bottom number) Severe headaches not improving with Tylenol Serious difficulty catching your breath Any worsening symptoms from Zone 2  Preterm Labor and Birth Information  The normal length of a pregnancy is 39-41 weeks. Preterm labor is when labor starts before 37 completed weeks of pregnancy. What are the risk factors for preterm labor? Preterm labor is more likely to occur in women who: Have certain infections during pregnancy such as a bladder infection, sexually transmitted infection, or infection inside the uterus (chorioamnionitis). Have a shorter-than-normal cervix. Have gone into preterm labor before. Have had surgery on their cervix. Are younger than age 17  or older than age 35. Are African American. Are pregnant with twins or multiple babies (multiple gestation). Take street drugs or smoke while pregnant. Do not gain enough weight while pregnant. Became pregnant shortly after having been pregnant. What are the symptoms of preterm labor? Symptoms of preterm labor include: Cramps similar to those that can happen during a menstrual period. The cramps may happen with diarrhea. Pain in the abdomen or lower back. Regular uterine contractions that may feel like tightening of the abdomen. A feeling of increased pressure in the pelvis. Increased watery or bloody mucus discharge from the vagina. Water breaking (ruptured amniotic sac). Why is it important to recognize signs of preterm labor? It is important to recognize signs of preterm labor because babies who are born prematurely may not be fully developed. This can put them at an increased risk for: Long-term (chronic) heart and lung problems. Difficulty immediately after birth with regulating body systems, including blood sugar, body temperature, heart rate, and breathing rate. Bleeding in the brain. Cerebral palsy. Learning difficulties. Death. These risks are highest for babies who are born before 34 weeks   of pregnancy. How is preterm labor treated? Treatment depends on the length of your pregnancy, your condition, and the health of your baby. It may involve: Having a stitch (suture) placed in your cervix to prevent your cervix from opening too early (cerclage). Taking or being given medicines, such as: Hormone medicines. These may be given early in pregnancy to help support the pregnancy. Medicine to stop contractions. Medicines to help mature the baby's lungs. These may be prescribed if the risk of delivery is high. Medicines to prevent your baby from developing cerebral palsy. If the labor happens before 34 weeks of pregnancy, you may need to stay in the hospital. What should I do if I  think I am in preterm labor? If you think that you are going into preterm labor, call your health care provider right away. How can I prevent preterm labor in future pregnancies? To increase your chance of having a full-term pregnancy: Do not use any tobacco products, such as cigarettes, chewing tobacco, and e-cigarettes. If you need help quitting, ask your health care provider. Do not use street drugs or medicines that have not been prescribed to you during your pregnancy. Talk with your health care provider before taking any herbal supplements, even if you have been taking them regularly. Make sure you gain a healthy amount of weight during your pregnancy. Watch for infection. If you think that you might have an infection, get it checked right away. Make sure to tell your health care provider if you have gone into preterm labor before. This information is not intended to replace advice given to you by your health care provider. Make sure you discuss any questions you have with your health care provider. Document Revised: 03/12/2019 Document Reviewed: 04/10/2016 Elsevier Patient Education  2020 Elsevier Inc.   

## 2021-10-10 ENCOUNTER — Inpatient Hospital Stay (HOSPITAL_COMMUNITY)
Admission: AD | Admit: 2021-10-10 | Discharge: 2021-10-13 | DRG: 807 | Disposition: A | Discharge status: Home / Self Care | Payer: Medicaid Other | Attending: Obstetrics and Gynecology | Admitting: Obstetrics and Gynecology

## 2021-10-10 ENCOUNTER — Encounter: Payer: Self-pay | Admitting: *Deleted

## 2021-10-10 ENCOUNTER — Other Ambulatory Visit: Payer: Self-pay

## 2021-10-10 ENCOUNTER — Ambulatory Visit (INDEPENDENT_AMBULATORY_CARE_PROVIDER_SITE_OTHER): Payer: Medicaid Other | Admitting: *Deleted

## 2021-10-10 ENCOUNTER — Encounter (HOSPITAL_COMMUNITY): Payer: Self-pay | Admitting: Obstetrics and Gynecology

## 2021-10-10 VITALS — BP 142/85 | HR 98 | Wt 163.0 lb

## 2021-10-10 DIAGNOSIS — O1414 Severe pre-eclampsia complicating childbirth: Secondary | ICD-10-CM | POA: Diagnosis present

## 2021-10-10 DIAGNOSIS — Z3483 Encounter for supervision of other normal pregnancy, third trimester: Secondary | ICD-10-CM

## 2021-10-10 DIAGNOSIS — Z30017 Encounter for initial prescription of implantable subdermal contraceptive: Secondary | ICD-10-CM | POA: Diagnosis not present

## 2021-10-10 DIAGNOSIS — Z20822 Contact with and (suspected) exposure to covid-19: Secondary | ICD-10-CM | POA: Diagnosis present

## 2021-10-10 DIAGNOSIS — F32A Depression, unspecified: Secondary | ICD-10-CM | POA: Diagnosis present

## 2021-10-10 DIAGNOSIS — O9962 Diseases of the digestive system complicating childbirth: Secondary | ICD-10-CM | POA: Diagnosis present

## 2021-10-10 DIAGNOSIS — O99334 Smoking (tobacco) complicating childbirth: Secondary | ICD-10-CM | POA: Diagnosis present

## 2021-10-10 DIAGNOSIS — K219 Gastro-esophageal reflux disease without esophagitis: Secondary | ICD-10-CM | POA: Diagnosis present

## 2021-10-10 DIAGNOSIS — O09522 Supervision of elderly multigravida, second trimester: Secondary | ICD-10-CM

## 2021-10-10 DIAGNOSIS — F419 Anxiety disorder, unspecified: Secondary | ICD-10-CM | POA: Diagnosis present

## 2021-10-10 DIAGNOSIS — O9902 Anemia complicating childbirth: Secondary | ICD-10-CM | POA: Diagnosis present

## 2021-10-10 DIAGNOSIS — Z3A36 36 weeks gestation of pregnancy: Secondary | ICD-10-CM

## 2021-10-10 DIAGNOSIS — O1410 Severe pre-eclampsia, unspecified trimester: Secondary | ICD-10-CM | POA: Diagnosis present

## 2021-10-10 DIAGNOSIS — Z880 Allergy status to penicillin: Secondary | ICD-10-CM

## 2021-10-10 DIAGNOSIS — O99019 Anemia complicating pregnancy, unspecified trimester: Secondary | ICD-10-CM | POA: Diagnosis present

## 2021-10-10 DIAGNOSIS — F17210 Nicotine dependence, cigarettes, uncomplicated: Secondary | ICD-10-CM | POA: Diagnosis present

## 2021-10-10 DIAGNOSIS — F17200 Nicotine dependence, unspecified, uncomplicated: Secondary | ICD-10-CM | POA: Diagnosis present

## 2021-10-10 DIAGNOSIS — O99012 Anemia complicating pregnancy, second trimester: Secondary | ICD-10-CM

## 2021-10-10 DIAGNOSIS — Z0130 Encounter for examination of blood pressure without abnormal findings: Secondary | ICD-10-CM

## 2021-10-10 DIAGNOSIS — O09529 Supervision of elderly multigravida, unspecified trimester: Secondary | ICD-10-CM

## 2021-10-10 LAB — COMPREHENSIVE METABOLIC PANEL
ALT: 16 U/L (ref 0–44)
AST: 25 U/L (ref 15–41)
Albumin: 2.5 g/dL — ABNORMAL LOW (ref 3.5–5.0)
Alkaline Phosphatase: 144 U/L — ABNORMAL HIGH (ref 38–126)
Anion gap: 11 (ref 5–15)
BUN: <5 mg/dL — ABNORMAL LOW (ref 6–20)
CO2: 28 mmol/L (ref 22–32)
Calcium: 9.1 mg/dL (ref 8.9–10.3)
Chloride: 99 mmol/L (ref 98–111)
Creatinine, Ser: 0.53 mg/dL (ref 0.44–1.00)
GFR, Estimated: >60 mL/min (ref >60)
Glucose, Bld: 125 mg/dL — ABNORMAL HIGH (ref 70–99)
Potassium: 3 mmol/L — ABNORMAL LOW (ref 3.5–5.1)
Sodium: 138 mmol/L (ref 135–145)
Total Bilirubin: 0.4 mg/dL (ref 0.3–1.2)
Total Protein: 6 g/dL — ABNORMAL LOW (ref 6.5–8.1)

## 2021-10-10 LAB — POCT URINALYSIS DIPSTICK OB
Blood, UA: NEGATIVE
Glucose, UA: NEGATIVE
Ketones, UA: NEGATIVE
Leukocytes, UA: NEGATIVE
Nitrite, UA: NEGATIVE

## 2021-10-10 LAB — CERVICOVAGINAL ANCILLARY ONLY
Chlamydia: NEGATIVE
Comment: NEGATIVE
Comment: NORMAL
Neisseria Gonorrhea: NEGATIVE

## 2021-10-10 LAB — PROTEIN / CREATININE RATIO, URINE
Creatinine, Urine: 117.78 mg/dL
Protein Creatinine Ratio: 1.14 mg/mg{Cre} — ABNORMAL HIGH (ref 0.00–0.15)
Total Protein, Urine: 134 mg/dL

## 2021-10-10 LAB — CBC
HCT: 30.7 % — ABNORMAL LOW (ref 36.0–46.0)
Hemoglobin: 9.9 g/dL — ABNORMAL LOW (ref 12.0–15.0)
MCH: 29.7 pg (ref 26.0–34.0)
MCHC: 32.2 g/dL (ref 30.0–36.0)
MCV: 92.2 fL (ref 80.0–100.0)
Platelets: 390 10*3/uL (ref 150–400)
RBC: 3.33 MIL/uL — ABNORMAL LOW (ref 3.87–5.11)
RDW: 14.3 % (ref 11.5–15.5)
WBC: 14.3 10*3/uL — ABNORMAL HIGH (ref 4.0–10.5)
nRBC: 0.3 % — ABNORMAL HIGH (ref 0.0–0.2)

## 2021-10-10 LAB — RESP PANEL BY RT-PCR (FLU A&B, COVID) ARPGX2
Influenza A by PCR: NEGATIVE
Influenza B by PCR: NEGATIVE
SARS Coronavirus 2 by RT PCR: NEGATIVE

## 2021-10-10 LAB — TYPE AND SCREEN
ABO/RH(D): O POS
Antibody Screen: NEGATIVE

## 2021-10-10 MED ORDER — BETAMETHASONE SOD PHOS & ACET 6 (3-3) MG/ML IJ SUSP
12.0000 mg | Freq: Once | INTRAMUSCULAR | Status: AC
  Administered 2021-10-10: 12 mg via INTRAMUSCULAR
  Filled 2021-10-10: qty 5

## 2021-10-10 MED ORDER — LACTATED RINGERS IV SOLN
500.0000 mL | INTRAVENOUS | Status: DC | PRN
  Administered 2021-10-11: 500 mL via INTRAVENOUS

## 2021-10-10 MED ORDER — LABETALOL HCL 5 MG/ML IV SOLN: 40.0000 mg | INTRAVENOUS | Status: DC | PRN

## 2021-10-10 MED ORDER — LABETALOL HCL 5 MG/ML IV SOLN: 20.0000 mg | INTRAVENOUS | Status: DC | PRN

## 2021-10-10 MED ORDER — EPHEDRINE 5 MG/ML INJ
10.0000 mg | INTRAVENOUS | Status: AC | PRN
  Administered 2021-10-11 (×2): 10 mg via INTRAVENOUS
  Filled 2021-10-10: qty 5

## 2021-10-10 MED ORDER — SOD CITRATE-CITRIC ACID 500-334 MG/5ML PO SOLN
30.0000 mL | ORAL | Status: DC | PRN
  Administered 2021-10-10: 30 mL via ORAL
  Filled 2021-10-10: qty 30

## 2021-10-10 MED ORDER — MAGNESIUM SULFATE 40 GM/1000ML IV SOLN
2.0000 g/h | INTRAVENOUS | Status: DC
  Administered 2021-10-10 – 2021-10-11 (×2): 2 g/h via INTRAVENOUS
  Filled 2021-10-10 (×2): qty 1000

## 2021-10-10 MED ORDER — BUTALBITAL-APAP-CAFFEINE 50-325-40 MG PO TABS
2.0000 | ORAL_TABLET | Freq: Four times a day (QID) | ORAL | Status: DC | PRN
  Administered 2021-10-10 – 2021-10-11 (×2): 2 via ORAL
  Filled 2021-10-10 (×2): qty 2

## 2021-10-10 MED ORDER — ONDANSETRON 4 MG PO TBDP
8.0000 mg | ORAL_TABLET | Freq: Once | ORAL | Status: AC
  Administered 2021-10-10: 8 mg via ORAL
  Filled 2021-10-10: qty 2

## 2021-10-10 MED ORDER — FENTANYL-BUPIVACAINE-NACL 0.5-0.125-0.9 MG/250ML-% EP SOLN
12.0000 mL/h | EPIDURAL | Status: DC | PRN
  Filled 2021-10-10: qty 250

## 2021-10-10 MED ORDER — DIPHENHYDRAMINE HCL 50 MG/ML IJ SOLN: 12.5000 mg | INTRAMUSCULAR | Status: DC | PRN

## 2021-10-10 MED ORDER — LABETALOL HCL 5 MG/ML IV SOLN: 80.0000 mg | INTRAVENOUS | Status: DC | PRN

## 2021-10-10 MED ORDER — MAGNESIUM SULFATE BOLUS VIA INFUSION
4.0000 g | Freq: Once | INTRAVENOUS | Status: AC
  Administered 2021-10-10: 4 g via INTRAVENOUS
  Filled 2021-10-10: qty 1000

## 2021-10-10 MED ORDER — OXYTOCIN-SODIUM CHLORIDE 30-0.9 UT/500ML-% IV SOLN
2.5000 [IU]/h | INTRAVENOUS | Status: DC
  Administered 2021-10-11: 2.5 [IU]/h via INTRAVENOUS
  Filled 2021-10-10: qty 500

## 2021-10-10 MED ORDER — OXYCODONE-ACETAMINOPHEN 5-325 MG PO TABS: 1.0000 | ORAL_TABLET | ORAL | Status: DC | PRN

## 2021-10-10 MED ORDER — LACTATED RINGERS IV SOLN: INTRAVENOUS | Status: DC

## 2021-10-10 MED ORDER — LACTATED RINGERS IV SOLN
500.0000 mL | Freq: Once | INTRAVENOUS | Status: AC
  Administered 2021-10-11: 500 mL via INTRAVENOUS

## 2021-10-10 MED ORDER — LIDOCAINE HCL (PF) 1 % IJ SOLN
30.0000 mL | INTRAMUSCULAR | Status: AC | PRN
  Administered 2021-10-11: 30 mL via SUBCUTANEOUS
  Filled 2021-10-10: qty 30

## 2021-10-10 MED ORDER — OXYTOCIN-SODIUM CHLORIDE 30-0.9 UT/500ML-% IV SOLN
1.0000 m[IU]/min | INTRAVENOUS | Status: DC
Start: 2021-10-10 — End: 2021-10-11
  Administered 2021-10-10: 2 m[IU]/min via INTRAVENOUS
  Filled 2021-10-10: qty 500

## 2021-10-10 MED ORDER — PHENYLEPHRINE 40 MCG/ML (10ML) SYRINGE FOR IV PUSH (FOR BLOOD PRESSURE SUPPORT)
80.0000 ug | PREFILLED_SYRINGE | INTRAVENOUS | Status: DC | PRN
  Administered 2021-10-11: 40 ug via INTRAVENOUS
  Administered 2021-10-11: 80 ug via INTRAVENOUS

## 2021-10-10 MED ORDER — HYDRALAZINE HCL 20 MG/ML IJ SOLN: 10.0000 mg | INTRAMUSCULAR | Status: DC | PRN

## 2021-10-10 MED ORDER — FAMOTIDINE 20 MG PO TABS
40.0000 mg | ORAL_TABLET | Freq: Every day | ORAL | Status: DC | PRN
  Administered 2021-10-10: 40 mg via ORAL
  Filled 2021-10-10: qty 2

## 2021-10-10 MED ORDER — ONDANSETRON HCL 4 MG/2ML IJ SOLN
4.0000 mg | Freq: Four times a day (QID) | INTRAMUSCULAR | Status: DC | PRN
  Administered 2021-10-11: 4 mg via INTRAVENOUS
  Filled 2021-10-10: qty 2

## 2021-10-10 MED ORDER — ACETAMINOPHEN 325 MG PO TABS: 650.0000 mg | ORAL_TABLET | ORAL | Status: DC | PRN

## 2021-10-10 MED ORDER — PHENYLEPHRINE 40 MCG/ML (10ML) SYRINGE FOR IV PUSH (FOR BLOOD PRESSURE SUPPORT)
80.0000 ug | PREFILLED_SYRINGE | INTRAVENOUS | Status: DC | PRN
  Administered 2021-10-11: 40 ug via INTRAVENOUS
  Administered 2021-10-11: 80 ug via INTRAVENOUS
  Filled 2021-10-10 (×2): qty 10

## 2021-10-10 MED ORDER — ACETAMINOPHEN 500 MG PO TABS
1000.0000 mg | ORAL_TABLET | Freq: Once | ORAL | Status: AC
  Administered 2021-10-10: 1000 mg via ORAL
  Filled 2021-10-10: qty 2

## 2021-10-10 MED ORDER — OXYCODONE-ACETAMINOPHEN 5-325 MG PO TABS: 2.0000 | ORAL_TABLET | ORAL | Status: DC | PRN

## 2021-10-10 MED ORDER — OXYTOCIN BOLUS FROM INFUSION
333.0000 mL | Freq: Once | INTRAVENOUS | Status: AC
  Administered 2021-10-11: 333 mL via INTRAVENOUS

## 2021-10-10 MED ORDER — CYCLOBENZAPRINE HCL 5 MG PO TABS
10.0000 mg | ORAL_TABLET | Freq: Once | ORAL | Status: AC
  Administered 2021-10-10: 10 mg via ORAL
  Filled 2021-10-10: qty 2

## 2021-10-10 MED ORDER — VANCOMYCIN HCL IN DEXTROSE 1-5 GM/200ML-% IV SOLN
1000.0000 mg | Freq: Two times a day (BID) | INTRAVENOUS | Status: DC
  Administered 2021-10-10 – 2021-10-11 (×2): 1000 mg via INTRAVENOUS
  Filled 2021-10-10 (×3): qty 200

## 2021-10-10 MED ORDER — TERBUTALINE SULFATE 1 MG/ML IJ SOLN
0.2500 mg | Freq: Once | INTRAMUSCULAR | Status: AC | PRN
  Administered 2021-10-11: 0.25 mg via SUBCUTANEOUS
  Filled 2021-10-10: qty 1

## 2021-10-10 NOTE — Progress Notes (Signed)
Patient ID: Ashley David, female   DOB: [birth year], 35 y.o.   MRN: [id number]  Reports 'medium' painful H/A; also still w heartburn after sodium citrate; not feeling ctx at all really; Pit started at 1830; mag sulfate infusing; s/p first dose Vanc at 2024; BMZ x 1 at 1505  BPs 111/60, 120/59, P 90 FHR 120s, +accels, no decels Ctx irreg with Pit at 74mu/min  Cx deferred (was 3/60/vtx -2 at start of Pit)  IUP@36 .5wks Pre w SF Favorable cx GBS unk  -Plan to uptitrate Pit to achieve active labor; anticipate AROM sometime during the night, and anticipate vag del -Vanc for GBS ppx (unknown) -Fioricet for H/A and Pepcid for GERD  Ashley David [facility] 10/10/2021 9:23 PM

## 2021-10-10 NOTE — MAU Note (Signed)
Pt reports elevated blood pressure in the office and at home. Pt reports she was sent here for further evaluation.   Pt reports sometimes she is dizzy and not feeling well.   Pt reports a little headache.   Denies vaginal bleeding or LOF.   Reports a chuck of mucous came out today.   Pt reports decreased fetal movement for the last 2 days.

## 2021-10-10 NOTE — Progress Notes (Deleted)
   NURSE VISIT- BLOOD PRESSURE CHECK  SUBJECTIVE:  Ashley David is a 35 y.o. G34P2002 female here for BP check. She is [redacted]w[redacted]d pregnant    HYPERTENSION ROS:  Pregnant/postpartum:  Severe headaches that don't go away with tylenol/other medicines: No  Visual changes (seeing spots/double/blurred vision) Yes  Severe pain under right breast breast or in center of upper chest  yes but not severe Severe nausea/vomiting Yes  Taking medicines as instructed not applicable    OBJECTIVE:  BP (!) 151/88 (BP Location: Left Arm, Patient Position: Sitting, Cuff Size: Normal)   Pulse 95   Wt 163 lb (73.9 kg)   LMP 02/05/2021 (Exact Date)   BMI 30.80 kg/m   Appearance {appearance:315021::"alert, well appearing, and in no distress"}.  ASSESSMENT: Pregnancy [redacted]w[redacted]d  blood pressure check  PLAN: Discussed with Joellyn Haff, CNM, WHNP   Recommendations: {Blank single:19197::"no changes needed","new prescription will be sent","stop medicine 2 days before next visit","check pre-e labs today"}   Follow-up: {Blank single:19197::"as scheduled","in 2 days","in 2 weeks","in 4 weeks","in 3 months"}   Annamarie Dawley  10/10/2021 11:32 AM

## 2021-10-10 NOTE — H&P (Signed)
OBSTETRIC ADMISSION HISTORY AND PHYSICAL  Ashley David is a 35 y.o. female G67P2002 with IUP at [redacted]w[redacted]d by 12wk [place] presenting for pre-E w/SF by HA/vision changes criteria. She reports +FMs, No LOF, no VB, peripheral edema, and RUQ pain.  Reports frontal HA that improved wit meds but has persistent blurry vision. She plans on bottle feeding. She requests Nexplanon for birth control. She received her prenatal care at [facility].  Dating: By 12wk U/S --->  Estimated Date of Delivery: 11/02/21 Sono:  @[redacted]w[redacted]d , normal anatomy, cephalic presentation, posterior placenta, 312g, 33% EFW  Prenatal History/Complications:  -AMA -tobacco use -anemia -unstable lie  Past Medical History: Past Medical History:  Diagnosis Date   Anemia    Anxiety    Depression    Renal disorder    kidney stones    Past Surgical History: Past Surgical History:  Procedure Laterality Date   LITHOTRIPSY      Obstetrical History: OB History     Gravida  3   Para  2   Term  2   Preterm      AB      Living  2      SAB      IAB      Ectopic      Multiple      Live Births  2           Social History Social History   Socioeconomic History   Marital status: Single    Spouse name: Not on file   Number of children: Not on file   Years of education: Not on file   Highest education level: Not on file  Occupational History   Not on file  Tobacco Use   Smoking status: Every Day    Packs/day: 0.25    Years: 12.00    Pack years: 3.00    Types: Cigarettes   Smokeless tobacco: Never  Vaping Use   Vaping Use: Never used  Substance and Sexual Activity   Alcohol use: Not Currently   Drug use: Never   Sexual activity: Not Currently    Birth control/protection: None  Other Topics Concern   Not on file  Social History Narrative   Not on file   Social Determinants of Health   Financial Resource Strain: Medium Risk   Difficulty of Paying Living Expenses: Somewhat hard  Food Insecurity:  Food Insecurity Present   Worried About Running Out of Food in the Last Year: Sometimes true   Ran Out of Food in the Last Year: Sometimes true  Transportation Needs: No Transportation Needs   Lack of Transportation (Medical): No   Lack of Transportation (Non-Medical): No  Physical Activity: Inactive   Days of Exercise per Week: 0 days   Minutes of Exercise per Session: 0 min  Stress: Stress Concern Present   Feeling of Stress : To some extent  Social Connections: Socially Isolated   Frequency of Communication with Friends and Family: Twice a week   Frequency of Social Gatherings with Friends and Family: Never   Attends Religious Services: Never   : No   Attends Diplomatic Services operational officer: Never   Marital Status: Living with partner    Family History: Family History  Problem Relation Age of Onset   Diabetes Brother    Congestive Heart Failure Maternal Grandmother    Heart disease Maternal Grandmother    Heart disease Maternal Grandfather    Congestive Heart Failure Maternal Grandfather  Allergies: Allergies  Allergen Reactions   Amoxicillin Anaphylaxis   Penicillins Anaphylaxis    Did it involve swelling of the face/tongue/throat, SOB, or low BP? No Did it involve sudden or severe rash/hives, skin peeling, or any reaction on the inside of your mouth or nose? No Did you need to seek medical attention at a hospital or doctor's office? No When did it last happen?       If all above answers are "NO", may proceed with cephalosporin use.    Tequin [Gatifloxacin] Anaphylaxis    Medications Prior to Admission  Medication Sig Dispense Refill Last Dose   pantoprazole (PROTONIX) 20 MG tablet Take 1 tablet (20 mg total) by mouth 2 (two) times daily. 60 tablet 6 Past Week   promethazine (PHENERGAN) 25 MG tablet TAKE 1 TABLET BY MOUTH EVERY 6 HOURS AS NEEDED FOR NAUSEA OR VOMITING. 30 tablet 9 10/10/2021   Blood Pressure Monitor MISC For  regular home bp monitoring during pregnancy 1 each 0    calcium carbonate (TUMS EX) 750 MG chewable tablet Chew 2 tablets by mouth.      Doxylamine-Pyridoxine (DICLEGIS) 10-10 MG TBEC 2 tabs q hs, if sx persist add 1 tab q am on day 3, if sx persist add 1 tab q afternoon on day 4 (Patient not taking: No sig reported) 100 tablet 6    ferrous sulfate 325 (65 FE) MG tablet Take 325 mg by mouth daily with breakfast.      ondansetron (ZOFRAN ODT) 4 MG disintegrating tablet Take 1 tablet (4 mg total) by mouth every 8 (eight) hours as needed for nausea or vomiting. (Patient not taking: No sig reported) 30 tablet 3    sertraline (ZOLOFT) 25 MG tablet Take 1 tablet (25 mg total) by mouth daily. (Patient not taking: No sig reported) 30 tablet 3     Review of Systems  All systems reviewed and negative except as stated in HPI  Blood pressure (!) 140/91, pulse (!) 106, temperature 98.3 F (36.8 C), temperature source Oral, resp. rate 16, last menstrual period 02/05/2021, SpO2 98 %. General appearance: alert, cooperative, and no distress Lungs: clear to auscultation bilaterally Heart: regular rate and rhythm Abdomen: soft, non-tender; bowel sounds normal Extremities: Homans sign is negative, no sign of DVT Presentation: cephalic by BSUS Fetal monitoring: Baseline: 140 bpm, Variability: Good {> 6 bpm), Accelerations: Reactive, and Decelerations: Absent Uterine activity: UI, rare Dilation: 3 Effacement (%): 60 Station: -2 Exam by:: Shawna Orleans B CNM  Prenatal labs: ABO, Rh: --/--/O POS (11/09 1458) Antibody: NEG (11/09 1458) Rubella: 1.52 (05/24 1224) RPR: Non Reactive (09/13 0837)  HBsAg: Negative (05/24 1224)  HIV: Non Reactive (09/13 0837)  GBS:  unknown GTT: normal Genetic screening: NT/IT negative, Panorama low risk female Anatomy US: normal  Prenatal Transfer Tool  Maternal Diabetes: No Genetic Screening: Normal Maternal Ultrasounds/Referrals: Normal Fetal Ultrasounds or other Referrals:   None Maternal Substance Abuse:  Yes:  Type: Smoker Significant Maternal Medications:  None Significant Maternal Lab Results: None  Results for orders placed or performed during the hospital encounter of 10/10/21 (from the past 24 hour(s))  Protein / creatinine ratio, urine   Collection Time: 10/10/21  1:11 PM  Result Value Ref Range   Creatinine, Urine 117.78 mg/dL   Total Protein, Urine 134 mg/dL   Protein Creatinine Ratio 1.14 (H) 0.00 - 0.15 mg/mg[Cre]  Comprehensive metabolic panel   Collection Time: 10/10/21  1:17 PM  Result Value Ref Range   Sodium 138 135 -  145 mmol/L   Potassium 3.0 (L) 3.5 - 5.1 mmol/L   Chloride 99 98 - 111 mmol/L   CO2 28 22 - 32 mmol/L   Glucose, Bld 125 (H) 70 - 99 mg/dL   BUN <5 (L) 6 - 20 mg/dL   Creatinine, Ser 5.62 0.44 - 1.00 mg/dL   Calcium 9.1 8.9 - 56.3 mg/dL   Total Protein 6.0 (L) 6.5 - 8.1 g/dL   Albumin 2.5 (L) 3.5 - 5.0 g/dL   AST 25 15 - 41 U/L   ALT 16 0 - 44 U/L   Alkaline Phosphatase 144 (H) 38 - 126 U/L   Total Bilirubin 0.4 0.3 - 1.2 mg/dL   GFR, Estimated >89 >37 mL/min   Anion gap 11 5 - 15  CBC   Collection Time: 10/10/21  1:17 PM  Result Value Ref Range   WBC 14.3 (H) 4.0 - 10.5 K/uL   RBC 3.33 (L) 3.87 - 5.11 MIL/uL   Hemoglobin 9.9 (L) 12.0 - 15.0 g/dL   HCT 34.2 (L) 87.6 - 81.1 %   MCV 92.2 80.0 - 100.0 fL   MCH 29.7 26.0 - 34.0 pg   MCHC 32.2 30.0 - 36.0 g/dL   RDW 57.2 62.0 - 35.5 %   Platelets 390 150 - 400 K/uL   nRBC 0.3 (H) 0.0 - 0.2 %  Type and screen [facility]   Collection Time: 10/10/21  2:58 PM  Result Value Ref Range   ABO/RH(D) O POS    Antibody Screen NEG    Sample Expiration      10/13/2021,2359 Performed at [facility], [address] [place], [place] [address]   Results for orders placed or performed in visit on 10/10/21 (from the past 24 hour(s))  POC Urinalysis Dipstick OB   Collection Time: 10/10/21 11:29 AM  Result Value Ref Range   Color, UA     Clarity, UA      Glucose, UA Negative Negative   Bilirubin, UA     Ketones, UA neg    Spec Grav, UA     Blood, UA neg    pH, UA     POC,PROTEIN,UA Trace Negative, Trace, Small (1+), Moderate (2+), Large (3+), 4+   Urobilinogen, UA     Nitrite, UA neg    Leukocytes, UA Negative Negative   Appearance     Odor      Patient Active Problem List   Diagnosis Date Noted   Indication for care in labor and delivery, antepartum 10/10/2021   AMA (advanced maternal age) multigravida 35+ 05/16/2021   Smoker 05/16/2021   Anxiety and depression 05/16/2021   Anemia in pregnancy 05/16/2021   Encounter for supervision of normal pregnancy, antepartum 04/24/2021    Assessment/Plan:  Ashley David is a 35 y.o. G3P2002 at [redacted]w[redacted]d here for IOL for pre-E w/SF  #Labor: latent #Pain: analgesia/anesthesia prn #Fetal Well Being: Cat I #ID: GBS unknown with PCN allergy of anaphylaxis > Vanc, Rubella non-immune #Method Of Feeding: bottle feeding #Method Of Contraception:  Nexplanon   #Circ: yes  #pre-E w/ SF: #Fetal lie: breech yesterday. Scanned to be vertex upon presentation today. Concern for unstable fetal lie.  #Anemia in pregnancy: Hgb 9.9 on admission. Not requiring active management at this time.  Donette Larry, CNM 10/10/2021, 5:52 PM

## 2021-10-10 NOTE — Progress Notes (Addendum)
   NURSE VISIT- BLOOD PRESSURE CHECK  SUBJECTIVE:  Ashley David is a 35 y.o. G46P2002 female here for BP check. She is [redacted]w[redacted]d pregnant    HYPERTENSION ROS:  Pregnant/postpartum:  Severe headaches that don't go away with tylenol/other medicines: No  Visual changes (seeing spots/double/blurred vision) Yes  Severe pain under right breast breast or in center of upper chest  yes but not severe Severe nausea/vomiting Yes  Taking medicines as instructed not applicable    OBJECTIVE:  BP (!) 142/85 (BP Location: Left Arm, Patient Position: Sitting, Cuff Size: Normal)   Pulse 98   Wt 163 lb (73.9 kg)   LMP 02/05/2021 (Exact Date)   BMI 30.80 kg/m  First BP in office 151/88  Appearance alert, well appearing, and in no distress.  ASSESSMENT: Pregnancy [redacted]w[redacted]d  blood pressure check  PLAN: Discussed with Joellyn Haff, CNM, [facility]   Recommendations:  Selena Batten spoke with patient. She is going to MAU for evaluation.     Follow-up: as scheduled   Annamarie Dawley  10/10/2021 11:42 AM   Chart reviewed for nurse visit. Agree with plan of care. Pt home bp last night 165/90, +HA, worse last night, rates 4/10 right now, last took apap last night. Feels lightheaded/fatigued, blurred vision, RUQ pain, +nausea. Tr proteinuria. To MAU for full eval. Notified Thressa Sheller, CNM and Gerrit Heck, CNM.  Cheral Marker, [place] 10/10/2021 12:59 PM

## 2021-10-10 NOTE — MAU Provider Note (Signed)
History     CSN: [id number]  Arrival date and time: 10/10/21 1302   Event Date/Time   First Provider Initiated Contact with Patient 10/10/21 1357      Chief Complaint  Patient presents with   Hypertension   Decreased Fetal Movement   Ashley David is a 35 y.o. G3P2002 at [redacted]w[redacted]d who receives care at [facility].  She presents today for Hypertension.  Patient was seen in the office today and instructed to report for further evaluation.  Patient reports she has HA located on her temples that she describes as throbbing.  She reports HA started last night and has not resolved despite tylenol dosing.  She rates the HA a 4-5/10 and reports last dose tylenol at 6pm.  Patient also reports onset of blurry vision prior to HA that resolved, returned this morning and has been ongoing.  She states she has also been experiencing nausea and dizziness since this morning.  Patient denies vaignal concerns and reports some decreased fetal movement that has resolved.  She reports she last ate at 1230pm and had a fish sandwich, fries, and a drink.  Patient reports that yesterday she was told her baby was complete breech and that she would be scheduled for ECV and IOL on Friday.    OB History     Gravida  3   Para  2   Term  2   Preterm      AB      Living  2      SAB      IAB      Ectopic      Multiple      Live Births  2           Past Medical History:  Diagnosis Date   Anemia    Anxiety    Depression    Renal disorder    kidney stones    Past Surgical History:  Procedure Laterality Date   LITHOTRIPSY      Family History  Problem Relation Age of Onset   Diabetes Brother    Congestive Heart Failure Maternal Grandmother    Heart disease Maternal Grandmother    Heart disease Maternal Grandfather    Congestive Heart Failure Maternal Grandfather     Social History   Tobacco Use   Smoking status: Every Day    Packs/day: 0.25    Years: 12.00    Pack years: 3.00     Types: Cigarettes   Smokeless tobacco: Never  Vaping Use   Vaping Use: Never used  Substance Use Topics   Alcohol use: Not Currently   Drug use: Never    Allergies:  Allergies  Allergen Reactions   Amoxicillin Anaphylaxis   Penicillins Anaphylaxis    Did it involve swelling of the face/tongue/throat, SOB, or low BP? No Did it involve sudden or severe rash/hives, skin peeling, or any reaction on the inside of your mouth or nose? No Did you need to seek medical attention at a hospital or doctor's office? No When did it last happen?       If all above answers are "NO", may proceed with cephalosporin use.    Tequin [Gatifloxacin] Anaphylaxis    Medications Prior to Admission  Medication Sig Dispense Refill Last Dose   pantoprazole (PROTONIX) 20 MG tablet Take 1 tablet (20 mg total) by mouth 2 (two) times daily. 60 tablet 6 Past Week   promethazine (PHENERGAN) 25 MG tablet TAKE 1 TABLET BY MOUTH EVERY 6  HOURS AS NEEDED FOR NAUSEA OR VOMITING. 30 tablet 9 10/10/2021   Blood Pressure Monitor MISC For regular home bp monitoring during pregnancy 1 each 0    calcium carbonate (TUMS EX) 750 MG chewable tablet Chew 2 tablets by mouth.      Doxylamine-Pyridoxine (DICLEGIS) 10-10 MG TBEC 2 tabs q hs, if sx persist add 1 tab q am on day 3, if sx persist add 1 tab q afternoon on day 4 (Patient not taking: No sig reported) 100 tablet 6    ferrous sulfate 325 (65 FE) MG tablet Take 325 mg by mouth daily with breakfast.      ondansetron (ZOFRAN ODT) 4 MG disintegrating tablet Take 1 tablet (4 mg total) by mouth every 8 (eight) hours as needed for nausea or vomiting. (Patient not taking: No sig reported) 30 tablet 3    sertraline (ZOLOFT) 25 MG tablet Take 1 tablet (25 mg total) by mouth daily. (Patient not taking: No sig reported) 30 tablet 3     Review of Systems  Eyes:  Positive for visual disturbance.  Gastrointestinal:  Positive for nausea. Negative for vomiting.  Genitourinary:  Negative for  vaginal bleeding and vaginal discharge.  Neurological:  Positive for dizziness and headaches. Negative for light-headedness.  Physical Exam   Blood pressure 138/87, pulse (!) 102, temperature 98.2 F (36.8 C), temperature source Oral, resp. rate 12, last menstrual period 02/05/2021, SpO2 95 %. Vitals:   10/10/21 1332 10/10/21 1345 10/10/21 1400 10/10/21 1415  BP: (!) 146/79 138/87 138/75 132/75  Pulse: (!) 105 (!) 102 93 96  Resp:      Temp:      TempSrc:      SpO2:  95% 95% 95%    Physical Exam Constitutional:      Appearance: Normal appearance.  HENT:     Head: Normocephalic and atraumatic.  Eyes:     Conjunctiva/sclera: Conjunctivae normal.  Cardiovascular:     Rate and Rhythm: Normal rate and regular rhythm.  Pulmonary:     Effort: Pulmonary effort is normal. No respiratory distress.     Breath sounds: Normal breath sounds.  Abdominal:     Tenderness: There is no abdominal tenderness.     Comments: Gravid, Appears AGA  Musculoskeletal:        General: Normal range of motion.     Cervical back: Normal range of motion.     Right lower leg: No edema.     Left lower leg: No edema.     Right ankle: No swelling.     Left ankle: No swelling.     Comments: Negative Clonus  Skin:    General: Skin is warm and dry.  Neurological:     Mental Status: She is alert.  Psychiatric:        Mood and Affect: Mood normal.        Behavior: Behavior normal.        Thought Content: Thought content normal.    Fetal Assessment 145 bpm, Mod Var, -Decels, +15x15Accels Toco: No ctx graphed  MAU Course   Results for orders placed or performed during the hospital encounter of 10/10/21 (from the past 24 hour(s))  Protein / creatinine ratio, urine     Status: Abnormal   Collection Time: 10/10/21  1:11 PM  Result Value Ref Range   Creatinine, Urine 117.78 mg/dL   Total Protein, Urine 134 mg/dL   Protein Creatinine Ratio 1.14 (H) 0.00 - 0.15 mg/mg[Cre]  Comprehensive metabolic panel  Status: Abnormal (Preliminary result)   Collection Time: 10/10/21  1:17 PM  Result Value Ref Range   Sodium 138 135 - 145 mmol/L   Potassium 3.0 (L) 3.5 - 5.1 mmol/L   Chloride 99 98 - 111 mmol/L   CO2 28 22 - 32 mmol/L   Glucose, Bld 125 (H) 70 - 99 mg/dL   BUN <5 (L) 6 - 20 mg/dL   Creatinine, Ser 0.53 0.44 - 1.00 mg/dL   Calcium 9.1 8.9 - 10.3 mg/dL   Total Protein 6.0 (L) 6.5 - 8.1 g/dL   Albumin 2.5 (L) 3.5 - 5.0 g/dL   AST 25 15 - 41 U/L   ALT 16 0 - 44 U/L   Alkaline Phosphatase 144 (H) 38 - 126 U/L   Total Bilirubin PENDING 0.3 - 1.2 mg/dL   GFR, Estimated >60 >60 mL/min   Anion gap 11 5 - 15  CBC     Status: Abnormal   Collection Time: 10/10/21  1:17 PM  Result Value Ref Range   WBC 14.3 (H) 4.0 - 10.5 K/uL   RBC 3.33 (L) 3.87 - 5.11 MIL/uL   Hemoglobin 9.9 (L) 12.0 - 15.0 g/dL   HCT 30.7 (L) 36.0 - 46.0 %   MCV 92.2 80.0 - 100.0 fL   MCH 29.7 26.0 - 34.0 pg   MCHC 32.2 30.0 - 36.0 g/dL   RDW 14.3 11.5 - 15.5 %   Platelets 390 150 - 400 K/uL   nRBC 0.3 (H) 0.0 - 0.2 %   No results found.  MDM Physical Exam Labs: CBC, CMP, PC Ratio Measure BPQ15 min EFM Antiemetic Pain Management Assessment and Plan  35 year old G3P2002  SIUP at 36.5 weeks Cat I FT GHTN Headache Visual Disturbances Nausea   -Labs ordered while patient in triage -Exam performed and findings discussed. -Informed that labs are pending. -Discussed potential POC to include admission today or return later this week for IOL. -Will treat nausea and once resolved will give treatment for HA. -Zofran 8mg  ODT now. -Patient without questions.  Maryann Conners MSN, CNM 10/10/2021, 1:57 PM   Reassessment (2:39 PM)  -Labs return c/w PreEclampsia  -Dr. Sheryn Bison consulted and informed of patient status, evaluation, interventions, and results. Advised: *Scan and confirm presentation. *If vertex admit for IOL to L&D. *If breech admit to OBSCU and start MgSO4 infusion.  Plan for ECV tonight at  2030. -BSUS confirms vertex presentation. -Patient informed that concern is for unstable lie and to notify staff if any big movements felt during induction process. -Discussed MgSO4 infusion while in labor as well as BMZ dosing.  -Patient verbalizes understanding. -L&D Team contacted and informed of admission.  Requests medication for HA. -Flexeril and tylenol given.  -Nurse updated on POC.  Maryann Conners MSN, CNM Advanced Practice Provider, [facility]

## 2021-10-11 ENCOUNTER — Inpatient Hospital Stay (HOSPITAL_COMMUNITY): Payer: Medicaid Other | Admitting: Anesthesiology

## 2021-10-11 ENCOUNTER — Encounter (HOSPITAL_COMMUNITY): Payer: Self-pay | Admitting: Family Medicine

## 2021-10-11 DIAGNOSIS — O1414 Severe pre-eclampsia complicating childbirth: Secondary | ICD-10-CM | POA: Diagnosis not present

## 2021-10-11 DIAGNOSIS — Z3A36 36 weeks gestation of pregnancy: Secondary | ICD-10-CM | POA: Diagnosis not present

## 2021-10-11 LAB — RPR: RPR Ser Ql: NONREACTIVE

## 2021-10-11 LAB — CBC
HCT: 27 % — ABNORMAL LOW (ref 36.0–46.0)
HCT: 27 % — ABNORMAL LOW (ref 36.0–46.0)
Hemoglobin: 8.5 g/dL — ABNORMAL LOW (ref 12.0–15.0)
Hemoglobin: 9 g/dL — ABNORMAL LOW (ref 12.0–15.0)
MCH: 29.1 pg (ref 26.0–34.0)
MCH: 30.8 pg (ref 26.0–34.0)
MCHC: 31.5 g/dL (ref 30.0–36.0)
MCHC: 33.3 g/dL (ref 30.0–36.0)
MCV: 92.5 fL (ref 80.0–100.0)
MCV: 92.5 fL (ref 80.0–100.0)
Platelets: 321 10*3/uL (ref 150–400)
Platelets: 306 10*3/uL (ref 150–400)
RBC: 2.92 MIL/uL — ABNORMAL LOW (ref 3.87–5.11)
RBC: 2.92 MIL/uL — ABNORMAL LOW (ref 3.87–5.11)
RDW: 14.2 % (ref 11.5–15.5)
RDW: 14.6 % (ref 11.5–15.5)
WBC: 13.4 10*3/uL — ABNORMAL HIGH (ref 4.0–10.5)
WBC: 22.7 10*3/uL — ABNORMAL HIGH (ref 4.0–10.5)
nRBC: 0.2 % (ref 0.0–0.2)
nRBC: 0 % (ref 0.0–0.2)

## 2021-10-11 LAB — STREP GP B NAA+RFLX: Strep Gp B NAA+Rflx: NEGATIVE

## 2021-10-11 LAB — MAGNESIUM: Magnesium: 5.8 mg/dL — ABNORMAL HIGH (ref 1.7–2.4)

## 2021-10-11 MED ORDER — CARBOPROST TROMETHAMINE 250 MCG/ML IM SOLN
INTRAMUSCULAR | Status: AC
  Administered 2021-10-11: 250 ug
  Filled 2021-10-11: qty 1

## 2021-10-11 MED ORDER — NIFEDIPINE ER OSMOTIC RELEASE 30 MG PO TB24
30.0000 mg | ORAL_TABLET | Freq: Every day | ORAL | Status: DC
  Administered 2021-10-11 – 2021-10-13 (×3): 30 mg via ORAL
  Filled 2021-10-11 (×3): qty 1

## 2021-10-11 MED ORDER — COCONUT OIL OIL: 1.0000 "application " | TOPICAL_OIL | Status: DC | PRN

## 2021-10-11 MED ORDER — TERBUTALINE SULFATE 1 MG/ML IJ SOLN: 0.2500 mg | Freq: Once | INTRAMUSCULAR | Status: DC | PRN

## 2021-10-11 MED ORDER — LIDOCAINE HCL (PF) 1 % IJ SOLN
INTRAMUSCULAR | Status: DC | PRN
  Administered 2021-10-11: 10 mL via EPIDURAL
  Administered 2021-10-11: 2 mL via EPIDURAL

## 2021-10-11 MED ORDER — ONDANSETRON HCL 4 MG PO TABS: 4.0000 mg | ORAL_TABLET | ORAL | Status: DC | PRN

## 2021-10-11 MED ORDER — ACETAMINOPHEN 325 MG PO TABS
650.0000 mg | ORAL_TABLET | ORAL | Status: DC | PRN
  Administered 2021-10-11 – 2021-10-12 (×4): 650 mg via ORAL
  Filled 2021-10-11 (×7): qty 2

## 2021-10-11 MED ORDER — PRENATAL MULTIVITAMIN CH
1.0000 | ORAL_TABLET | Freq: Every day | ORAL | Status: DC
  Administered 2021-10-12 – 2021-10-13 (×2): 1 via ORAL
  Filled 2021-10-11 (×2): qty 1

## 2021-10-11 MED ORDER — DIPHENOXYLATE-ATROPINE 2.5-0.025 MG PO TABS
2.0000 | ORAL_TABLET | Freq: Two times a day (BID) | ORAL | Status: DC
  Administered 2021-10-11: 2 via ORAL
  Filled 2021-10-11: qty 2

## 2021-10-11 MED ORDER — IBUPROFEN 600 MG PO TABS
600.0000 mg | ORAL_TABLET | Freq: Four times a day (QID) | ORAL | Status: DC
  Administered 2021-10-11 – 2021-10-13 (×8): 600 mg via ORAL
  Filled 2021-10-11 (×8): qty 1

## 2021-10-11 MED ORDER — SIMETHICONE 80 MG PO CHEW: 80.0000 mg | CHEWABLE_TABLET | ORAL | Status: DC | PRN

## 2021-10-11 MED ORDER — METOCLOPRAMIDE HCL 5 MG/ML IJ SOLN
10.0000 mg | Freq: Once | INTRAMUSCULAR | Status: AC
  Administered 2021-10-11: 10 mg via INTRAVENOUS
  Filled 2021-10-11: qty 2

## 2021-10-11 MED ORDER — NICOTINE 14 MG/24HR TD PT24
14.0000 mg | MEDICATED_PATCH | Freq: Every day | TRANSDERMAL | Status: DC
  Administered 2021-10-11: 14 mg via TRANSDERMAL
  Filled 2021-10-11 (×3): qty 1

## 2021-10-11 MED ORDER — SENNOSIDES-DOCUSATE SODIUM 8.6-50 MG PO TABS
2.0000 | ORAL_TABLET | Freq: Every day | ORAL | Status: DC
  Administered 2021-10-12 – 2021-10-13 (×2): 2 via ORAL
  Filled 2021-10-11 (×2): qty 2

## 2021-10-11 MED ORDER — DIPHENHYDRAMINE HCL 25 MG PO CAPS: 25.0000 mg | ORAL_CAPSULE | Freq: Four times a day (QID) | ORAL | Status: DC | PRN

## 2021-10-11 MED ORDER — LACTATED RINGERS AMNIOINFUSION
INTRAVENOUS | Status: DC
  Filled 2021-10-11 (×3): qty 1000

## 2021-10-11 MED ORDER — OXYTOCIN-SODIUM CHLORIDE 30-0.9 UT/500ML-% IV SOLN: 1.0000 m[IU]/min | INTRAVENOUS | Status: DC

## 2021-10-11 MED ORDER — BENZOCAINE-MENTHOL 20-0.5 % EX AERO
1.0000 "application " | INHALATION_SPRAY | CUTANEOUS | Status: DC | PRN
  Administered 2021-10-11 – 2021-10-13 (×2): 1 via TOPICAL
  Filled 2021-10-11 (×2): qty 56

## 2021-10-11 MED ORDER — MAGNESIUM SULFATE 40 GM/1000ML IV SOLN: 1.0000 g/h | INTRAVENOUS | Status: DC

## 2021-10-11 MED ORDER — LACTATED RINGERS IV SOLN: INTRAVENOUS | Status: DC

## 2021-10-11 MED ORDER — LIDOCAINE-EPINEPHRINE (PF) 2 %-1:200000 IJ SOLN
INTRAMUSCULAR | Status: DC | PRN
Start: 2021-10-11 — End: 2021-10-11
  Administered 2021-10-11 (×2): 4 mL via EPIDURAL

## 2021-10-11 MED ORDER — DIBUCAINE (PERIANAL) 1 % EX OINT: 1.0000 "application " | TOPICAL_OINTMENT | CUTANEOUS | Status: DC | PRN

## 2021-10-11 MED ORDER — FENTANYL-BUPIVACAINE-NACL 0.5-0.125-0.9 MG/250ML-% EP SOLN
EPIDURAL | Status: DC | PRN
  Administered 2021-10-11: 12 mL/h via EPIDURAL

## 2021-10-11 MED ORDER — ONDANSETRON HCL 4 MG/2ML IJ SOLN
4.0000 mg | INTRAMUSCULAR | Status: DC | PRN
  Administered 2021-10-12: 4 mg via INTRAVENOUS
  Filled 2021-10-11: qty 2

## 2021-10-11 MED ORDER — MAGNESIUM SULFATE 40 GM/1000ML IV SOLN
2.0000 g/h | INTRAVENOUS | Status: AC
  Administered 2021-10-11: 1 g/h via INTRAVENOUS

## 2021-10-11 MED ORDER — WITCH HAZEL-GLYCERIN EX PADS: 1.0000 "application " | MEDICATED_PAD | CUTANEOUS | Status: DC | PRN

## 2021-10-11 MED ORDER — TETANUS-DIPHTH-ACELL PERTUSSIS 5-2.5-18.5 LF-MCG/0.5 IM SUSY: 0.5000 mL | PREFILLED_SYRINGE | Freq: Once | INTRAMUSCULAR | Status: DC

## 2021-10-11 NOTE — Discharge Summary (Signed)
Postpartum Discharge Summary     Patient Name: Ashley David DOB: 1986/10/25 MRN: [id number] Primary OBGYN: [facility]   Date of admission: 10/10/2021 Delivery date: 10/11/2021 Delivering provider: Julianne Handler  Date of discharge: 10/13/2021  Admitting diagnosis: Severe pre-eclampsia (Neuro s/s with mild range BPs) at 36/5 Secondary diagnosis:  Active Problems:   AMA (advanced maternal age) multigravida 35+   Smoker   Anxiety and depression   Anemia in pregnancy   Pre-eclampsia, severe    Discharge diagnosis: Preterm Pregnancy Delivered and Preeclampsia (severe)                                              Post partum procedures: Nexplanon placed in LUE on 10/13/2021 Augmentation: AROM and Pitocin Complications: None  Hospital course: Induction of Labor With Vaginal Delivery   35 y.o. yo G3P2002 at 60w6dwas admitted to the hospital 10/10/2021 for induction of labor.  Indication for induction: Preeclampsia.  Patient had an uncomplicated labor course as follows: Membrane Rupture Time/Date: 12:16 AM ,10/11/2021   Delivery Method:Vaginal, Spontaneous  Episiotomy: None  Lacerations:  1st degree;Perineal  (repaired) Details of delivery can be found in separate delivery note.  Patient had a routine postpartum course with 24 hours of PP Mg; she was started on qday procardia for her BPs.  Patient is discharged home 10/11/21. She had a nexplanon placed in her LUE prior to discharge home.   Baby's penis too small to circ so request made for 2-3 week appt [facility] to eval for circ then.   Newborn Data: Birth date:10/11/2021  Birth time:1:39 PM  Gender:Female  Living status:Living  Apgars: 6, 7 Weight:2580 g   Magnesium Sulfate received: Yes: Seizure prophylaxis BMZ received: Yes Rhophylac:N/A MMR:N/A T-DaP:Given prenatally Flu: No  Physical exam  Vitals:   10/12/21 2303 10/13/21 0349 10/13/21 0725 10/13/21 1135  BP: 129/61 (!) 150/71 (!) 146/73 136/68  Pulse: 99  87 84 83  Resp: 18 18 16 16   Temp: 99 F (37.2 C) 98.7 F (37.1 C) 98.6 F (37 C) 98.8 F (37.1 C)  TempSrc: Oral Oral Oral Oral  SpO2: 98% 99% 97% 98%   General: alert Lochia: appropriate Uterine Fundus: nttp Incision: deferred DVT Evaluation: No evidence of DVT seen on physical exam. Labs: CBC Latest Ref Rng & Units 10/12/2021 10/11/2021 10/11/2021  WBC 4.0 - 10.5 K/uL 15.8(H) 22.7(H) 13.4(H)  Hemoglobin 12.0 - 15.0 g/dL 7.6(L) 9.0(L) 8.5(L)  Hematocrit 36.0 - 46.0 % 23.3(L) 27.0(L) 27.0(L)  Platelets 150 - 400 K/uL 339 306 321   CMP Latest Ref Rng & Units 10/10/2021 03/25/2020 06/04/2019  Glucose 70 - 99 mg/dL 125(H) 105(H) 94  BUN 6 - 20 mg/dL <5(L) 9 7  Creatinine 0.44 - 1.00 mg/dL 0.53 0.52 0.47  Sodium 135 - 145 mmol/L 138 136 138  Potassium 3.5 - 5.1 mmol/L 3.0(L) 4.0 3.3(L)  Chloride 98 - 111 mmol/L 99 102 104  CO2 22 - 32 mmol/L 28 26 22   Calcium 8.9 - 10.3 mg/dL 9.1 9.2 9.1  Total Protein 6.5 - 8.1 g/dL 6.0(L) 7.4 7.1  Total Bilirubin 0.3 - 1.2 mg/dL 0.4 0.3 0.2(L)  Alkaline Phos 38 - 126 U/L 144(H) 57 44  AST 15 - 41 U/L 25 17 21   ALT 0 - 44 U/L 16 16 20     Edinburgh Score: No flowsheet data found.   After  visit meds:  Allergies as of 10/13/2021       Reactions   Amoxicillin Anaphylaxis   Penicillins Anaphylaxis   Did it involve swelling of the face/tongue/throat, SOB, or low BP? No Did it involve sudden or severe rash/hives, skin peeling, or any reaction on the inside of your mouth or nose? No Did you need to seek medical attention at a hospital or doctor's office? No When did it last happen?       If all above answers are "NO", may proceed with cephalosporin use.   Tequin [gatifloxacin] Anaphylaxis        Medication List     STOP taking these medications    sertraline 25 MG tablet Commonly known as: Zoloft       TAKE these medications    acetaminophen 325 MG tablet Commonly known as: Tylenol Take 2 tablets (650 mg total) by mouth  every 4 (four) hours as needed (for pain scale < 4).   Blood Pressure Monitor Misc For regular home bp monitoring during pregnancy   calcium carbonate 750 MG chewable tablet Commonly known as: TUMS EX Chew 2 tablets by mouth.   ferrous sulfate 325 (65 FE) MG tablet Take 325 mg by mouth daily with breakfast.   ibuprofen 600 MG tablet Commonly known as: ADVIL Take 1 tablet (600 mg total) by mouth every 6 (six) hours as needed.   NIFEdipine 30 MG 24 hr tablet Commonly known as: ADALAT CC Take 1 tablet (30 mg total) by mouth daily.   pantoprazole 20 MG tablet Commonly known as: Protonix Take 1 tablet (20 mg total) by mouth 2 (two) times daily.   promethazine 25 MG tablet Commonly known as: PHENERGAN TAKE 1 TABLET BY MOUTH EVERY 6 HOURS AS NEEDED FOR NAUSEA OR VOMITING.         Discharge home in stable condition Infant Disposition:home with mother Breast or formula: formula Discharge instruction: per After Visit Summary and Postpartum booklet. Activity: Advance as tolerated. Pelvic rest for 6 weeks.  Diet: routine diet Future Appointments: Future Appointments  Date Time Provider [place]  10/17/2021 10:50 AM [facility] NURSE [facility]  11/01/2021 10:30 AM [facility] NURSE [facility]  11/14/2021  1:50 PM Myrtis Ser, CNM [facility]   Follow up Visit:  Please schedule this patient for Postpartum visit in: 6 weeks with the following provider: Any provider  In-Person  For C/S patients schedule nurse incision check in weeks 2 weeks: no  High risk pregnancy complicated by: HTN  Delivery mode:  SVD  Anticipated Birth Control:  PP Nexplanon placed  PP Procedures needed: BP check  Schedule Integrated [place] visit: no    Durene Romans MD Attending Center for [employer] farm manager)

## 2021-10-11 NOTE — Anesthesia Procedure Notes (Signed)
Epidural Patient location during procedure: OB Start time: 10/11/2021 10:20 AM End time: 10/11/2021 10:28 AM  Staffing Anesthesiologist: Mal Amabile, MD Performed: anesthesiologist   Preanesthetic Checklist Completed: patient identified, IV checked, site marked, risks and benefits discussed, surgical consent, monitors and equipment checked, pre-op evaluation and timeout performed  Epidural Patient position: sitting Prep: DuraPrep and site prepped and draped Patient monitoring: continuous pulse ox and blood pressure Approach: midline Location: L3-L4 Injection technique: LOR air  Needle:  Needle type: Tuohy  Needle gauge: 17 G Needle length: 9 cm and 9 Needle insertion depth: 5 cm Catheter type: closed end flexible Catheter size: 19 Gauge Catheter at skin depth: 10 cm Test dose: negative and Other  Assessment Events: blood not aspirated, injection not painful, no injection resistance, no paresthesia and negative IV test  Additional Notes Patient identified. Risks and benefits discussed including failed block, incomplete  Pain control, post dural puncture headache, nerve damage, paralysis, blood pressure Changes, nausea, vomiting, reactions to medications-both toxic and allergic and post Partum back pain. All questions were answered. Patient expressed understanding and wished to proceed. Sterile technique was used throughout procedure. Epidural site was Dressed with sterile barrier dressing. No paresthesias, signs of intravascular injection Or signs of intrathecal spread were encountered.  Patient was more comfortable after the epidural was dosed. Please see RN's note for documentation of vital signs and FHR which are stable. Reason for block:procedure for pain

## 2021-10-11 NOTE — Anesthesia Procedure Notes (Signed)
Epidural Patient location during procedure: OB Start time: 10/11/2021 2:09 AM End time: 10/11/2021 2:19 AM  Staffing Anesthesiologist: Lannie Fields, DO Performed: anesthesiologist   Preanesthetic Checklist Completed: patient identified, IV checked, risks and benefits discussed, monitors and equipment checked, pre-op evaluation and timeout performed  Epidural Patient position: sitting Prep: DuraPrep and site prepped and draped Patient monitoring: continuous pulse ox, blood pressure, heart rate and cardiac monitor Approach: midline Location: L3-L4 Injection technique: LOR air  Needle:  Needle type: Tuohy  Needle gauge: 17 G Needle length: 9 cm Needle insertion depth: 7 cm Catheter type: closed end flexible Catheter size: 19 Gauge Catheter at skin depth: 12 cm Test dose: negative  Assessment Sensory level: T8 Events: blood not aspirated, injection not painful, no injection resistance, no paresthesia and negative IV test  Additional Notes Patient identified. Risks/Benefits/Options discussed with patient including but not limited to bleeding, infection, nerve damage, paralysis, failed block, incomplete pain control, headache, blood pressure changes, nausea, vomiting, reactions to medication both or allergic, itching and postpartum back pain. Confirmed with bedside nurse the patient's most recent platelet count. Confirmed with patient that they are not currently taking any anticoagulation, have any bleeding history or any family history of bleeding disorders. Patient expressed understanding and wished to proceed. All questions were answered. Sterile technique was used throughout the entire procedure. Please see nursing notes for vital signs. Test dose was given through epidural catheter and negative prior to continuing to dose epidural or start infusion. Warning signs of high block given to the patient including shortness of breath, tingling/numbness in hands, complete motor  block, or any concerning symptoms with instructions to call for help. Patient was given instructions on fall risk and not to get out of bed. All questions and concerns addressed with instructions to call with any issues or inadequate analgesia.  Reason for block:procedure for pain

## 2021-10-11 NOTE — Progress Notes (Signed)
Ashley David, cnm called and made aware of new onset lethergy with decrease in relexes.  Orders rec for magenisum blood level.

## 2021-10-11 NOTE — Progress Notes (Signed)
Patient ID: Ashley David, female   DOB: Jul 02, 1986, 35 y.o.   MRN: [id number]  Feeling ctx/cramping a little bit more; head feels a little better  BPs 110/50, 140/82, 105/61 FHR 125-140, lots of baby activity and difficult to trace during exam Ctx 2-3 mins with Pit at 32mu/min as of MN Cx 3/60/vtx -2; AROM for clear fluid  IUP@36 .6wks Pre-e w SF Cx favorable  -Keep ctx reg q 2 mins with Pitocin; hopeful that with AROM now as well that she will progress to active labor/delivery  Ashley David CNM 10/11/2021 12:34 AM

## 2021-10-11 NOTE — Anesthesia Preprocedure Evaluation (Signed)
Anesthesia Evaluation  Patient identified by MRN, date of birth, ID band Patient awake    Reviewed: Allergy & Precautions, Patient's Chart, lab work & pertinent test results  Airway Mallampati: II  TM Distance: >3 FB Neck ROM: Full    Dental no notable dental hx.    Pulmonary Current Smoker,    Pulmonary exam normal breath sounds clear to auscultation       Cardiovascular hypertension (preE with SF), Normal cardiovascular exam Rhythm:Regular Rate:Normal     Neuro/Psych PSYCHIATRIC DISORDERS Anxiety Depression negative neurological ROS     GI/Hepatic negative GI ROS, Neg liver ROS,   Endo/Other  negative endocrine ROS  Renal/GU negative Renal ROS  negative genitourinary   Musculoskeletal negative musculoskeletal ROS (+)   Abdominal   Peds negative pediatric ROS (+)  Hematology  (+) Blood dyscrasia, anemia , Hb 8.5, plt 321   Anesthesia Other Findings   Reproductive/Obstetrics (+) Pregnancy                             Anesthesia Physical Anesthesia Plan  ASA: 3  Anesthesia Plan: Epidural   Post-op Pain Management:    Induction:   PONV Risk Score and Plan: 2  Airway Management Planned: Natural Airway  Additional Equipment: None  Intra-op Plan:   Post-operative Plan:   Informed Consent: I have reviewed the patients History and Physical, chart, labs and discussed the procedure including the risks, benefits and alternatives for the proposed anesthesia with the patient or authorized representative who has indicated his/her understanding and acceptance.       Plan Discussed with:   Anesthesia Plan Comments:         Anesthesia Quick Evaluation

## 2021-10-11 NOTE — Progress Notes (Signed)
LABOR PROGRESS NOTE  Ashley David is a 35 y.o. G3P2002 at [redacted]w[redacted]d  presented for pre-E w/ SF by HA/vision changes criteria.  Subjective: Patient's pain is more tolerable with epidural. She has continued to feel nauseous despite medications.   Objective: BP 113/62   Pulse 92   Temp 97.6 F (36.4 C) (Oral)   Resp 16   LMP 02/05/2021 (Exact Date)   SpO2 97%  or  Vitals:   10/11/21 1046 10/11/21 1050 10/11/21 1055 10/11/21 1100  BP: 112/62 118/66 114/62 113/62  Pulse: 98 (!) 105 92 92  Resp: 18 16  16   Temp:      TempSrc:      SpO2:        Dilation: 7 Effacement (%): 70 Cervical Position: Middle Station: -1 Presentation: Vertex Exam by:: [email or web address], RN Fetal monitoring: Baseline: 110 bpm, Variability: Fair (1-6 bpm), Accelerations: absent, and Decelerations: Variable Uterine activity: Frequency: Every 4-5 minutes  Labs: Lab Results  Component Value Date   WBC 13.4 (H) 10/11/2021   HGB 8.5 (L) 10/11/2021   HCT 27.0 (L) 10/11/2021   MCV 92.5 10/11/2021   PLT 321 10/11/2021    Patient Active Problem List   Diagnosis Date Noted   Indication for care in labor and delivery, antepartum 10/10/2021   Pre-eclampsia, severe 10/10/2021   AMA (advanced maternal age) multigravida 35+ 05/16/2021   Smoker 05/16/2021   Anxiety and depression 05/16/2021   Anemia in pregnancy 05/16/2021   Encounter for supervision of normal pregnancy, antepartum 04/24/2021    Assessment / Plan: 35 y.o. G3P2002 at [redacted]w[redacted]d here for IOL for pre-E w/ SF (headache/vision changes).  Labor: Placed IUPC. Monitor for strength of contractions. Consider amnioinfusion given variable decels. Continue active management with pitocin as well, currently at 92mL/hr. Fetal Wellbeing:  Category II. Minimal variability likely correlated to Mag. Continue to monitor Pain Control:  Epidural Anticipated MOD:  Vaginal #GBS  unknown  > Vanc #pre-E w/ SF (HA/vision criteria): continues to receive mag. Reflexes unchanged.  Feeling headaches and vision changes. Suspected vision changes to be due to  #nausea: Ordered reglan to assist with nausea  4m, DO 10/11/2021, 11:33 AM PGY-1, [facility]

## 2021-10-11 NOTE — Progress Notes (Signed)
Dr Malen Gauze called and made aware of low blood pressures holding steady in the 90s/50s.  Notified of pt dizziness and lethargy that is newer in presentation.  MD made aware that 500cc bolus was given with his epidural placement, and pressors have been given to maintain blood pressures.  Dr Malen Gauze will come assess pt and turn down rate on epidural pump.

## 2021-10-11 NOTE — Anesthesia Postprocedure Evaluation (Signed)
Anesthesia Post Note  Patient: Ashley David  Procedure(s) Performed: AN AD HOC LABOR EPIDURAL     Patient location during evaluation: OB High Risk Anesthesia Type: Epidural Level of consciousness: awake and alert Pain management: pain level controlled Vital Signs Assessment: post-procedure vital signs reviewed and stable Respiratory status: spontaneous breathing, nonlabored ventilation and respiratory function stable Cardiovascular status: stable Postop Assessment: no headache, no backache and epidural receding Anesthetic complications: no Comments: Patient states both epidural provided no relief.  Questions and concerns addressed.   No notable events documented.  Last Vitals:  Vitals:   10/11/21 1810 10/11/21 1959  BP: 133/77 133/67  Pulse: 87 86  Resp: 18 18  Temp: 36.7 C 36.8 C  SpO2: 100% 99%    Last Pain:  Vitals:   10/11/21 2000  TempSrc:   PainSc: 8    Pain Goal:                   [name]

## 2021-10-11 NOTE — Progress Notes (Signed)
Patient ID: Ashley David, female   DOB: Feb 17, 1986, 35 y.o.   MRN: [id number]  CTSP for FHR variables that began to occur with lower BP values; s/p epidural placement at 0200; FHR variables were early in nature to nadir of 60 each; Pit was stopped (had been up to 12mu/min), position changes, IVF bolus and Terb given as well as measures for hypotension; H&K position eventually helped; Cx 7/90/vtx -1 and IFSE placed; will allow FHR to recover and then plan to restart Pitocin  [name] CNM 10/11/2021

## 2021-10-12 LAB — CULTURE, BETA STREP (GROUP B ONLY)

## 2021-10-12 LAB — CBC
HCT: 23.3 % — ABNORMAL LOW (ref 36.0–46.0)
Hemoglobin: 7.6 g/dL — ABNORMAL LOW (ref 12.0–15.0)
MCH: 30.2 pg (ref 26.0–34.0)
MCHC: 32.6 g/dL (ref 30.0–36.0)
MCV: 92.5 fL (ref 80.0–100.0)
Platelets: 339 10*3/uL (ref 150–400)
RBC: 2.52 MIL/uL — ABNORMAL LOW (ref 3.87–5.11)
RDW: 14.4 % (ref 11.5–15.5)
WBC: 15.8 10*3/uL — ABNORMAL HIGH (ref 4.0–10.5)
nRBC: 0 % (ref 0.0–0.2)

## 2021-10-12 MED ORDER — FERROUS SULFATE 325 (65 FE) MG PO TABS
325.0000 mg | ORAL_TABLET | ORAL | Status: DC
  Administered 2021-10-12: 325 mg via ORAL
  Filled 2021-10-12: qty 1

## 2021-10-12 MED ORDER — FUROSEMIDE 40 MG PO TABS
20.0000 mg | ORAL_TABLET | Freq: Two times a day (BID) | ORAL | Status: AC
  Administered 2021-10-12 – 2021-10-13 (×2): 20 mg via ORAL
  Filled 2021-10-12 (×3): qty 1

## 2021-10-12 MED ORDER — OXYCODONE HCL 5 MG PO TABS
5.0000 mg | ORAL_TABLET | Freq: Four times a day (QID) | ORAL | Status: DC | PRN
  Administered 2021-10-12 – 2021-10-13 (×3): 5 mg via ORAL
  Filled 2021-10-12 (×3): qty 1

## 2021-10-12 MED ORDER — POTASSIUM CHLORIDE CRYS ER 20 MEQ PO TBCR
30.0000 meq | EXTENDED_RELEASE_TABLET | Freq: Two times a day (BID) | ORAL | Status: DC
  Administered 2021-10-12 – 2021-10-13 (×3): 30 meq via ORAL
  Filled 2021-10-12 (×3): qty 2

## 2021-10-12 MED ORDER — ETONOGESTREL 68 MG ~~LOC~~ IMPL
68.0000 mg | DRUG_IMPLANT | Freq: Once | SUBCUTANEOUS | Status: AC
  Administered 2021-10-13: 68 mg via SUBCUTANEOUS
  Filled 2021-10-12: qty 1

## 2021-10-12 MED ORDER — LIDOCAINE HCL 1 % IJ SOLN
0.0000 mL | Freq: Once | INTRAMUSCULAR | Status: DC | PRN
  Filled 2021-10-12: qty 20

## 2021-10-12 MED ORDER — FUROSEMIDE 40 MG PO TABS: 20.0000 mg | ORAL_TABLET | Freq: Two times a day (BID) | ORAL | Status: DC

## 2021-10-12 MED ORDER — LACTATED RINGERS IV SOLN: INTRAVENOUS | Status: AC

## 2021-10-12 NOTE — Progress Notes (Signed)
OB note Baby too small to circ. I d/w mom that will reassess at FT visit in 2-3wks and see then.   Cornelia Copa MD Attending Center for [employer] (Faculty Practice) 10/12/2021 Time: 1019am

## 2021-10-12 NOTE — Progress Notes (Signed)
Post Partum Day 1 Subjective: no complaints, up ad lib, voiding, and tolerating PO  Objective: Blood pressure 125/71, pulse 90, temperature 98.2 F (36.8 C), temperature source Oral, resp. rate 16, last menstrual period 02/05/2021, SpO2 96 %, unknown if currently breastfeeding. Vitals:   10/12/21 0302 10/12/21 0451 10/12/21 0557 10/12/21 0735  BP: 125/66   125/71  Pulse: 99   90  Resp: 18 18 17 16   Temp: 98.3 F (36.8 C)   98.2 F (36.8 C)  TempSrc: Oral   Oral  SpO2: 99%   96%    Intake/Output Summary (Last 24 hours) at 10/12/2021 13/10/2021 Last data filed at 10/12/2021 0600 Gross per 24 hour  Intake 4345.5 ml  Output 5725 ml  Net -1379.5 ml    Physical Exam:  General: alert, cooperative, and appears stated age 35: appropriate Uterine Fundus: firm DVT Evaluation: No evidence of DVT seen on physical exam.  Recent Labs    10/11/21 1536 10/12/21 0409  HGB 9.0* 7.6*  HCT 27.0* 23.3*    Assessment/Plan: Plan for discharge tomorrow, Circumcision prior to discharge, and Contraception Nexplanon Will give some lasix to help facilitate fluid management Consented for circ PO iron   LOS: 2 days   13/11/22 10/12/2021, 8:25 AM

## 2021-10-13 ENCOUNTER — Encounter (HOSPITAL_COMMUNITY): Payer: Self-pay | Admitting: Family Medicine

## 2021-10-13 DIAGNOSIS — Z30017 Encounter for initial prescription of implantable subdermal contraceptive: Secondary | ICD-10-CM | POA: Diagnosis not present

## 2021-10-13 HISTORY — PX: INSERTION OF IMPLANON ROD: OBO 1005

## 2021-10-13 MED ORDER — IBUPROFEN 600 MG PO TABS
600.0000 mg | ORAL_TABLET | Freq: Four times a day (QID) | ORAL | 0 refills | Status: AC | PRN
Start: 2021-10-13 — End: ?

## 2021-10-13 MED ORDER — ACETAMINOPHEN 325 MG PO TABS: 650.0000 mg | ORAL_TABLET | ORAL | Status: AC | PRN

## 2021-10-13 MED ORDER — NIFEDIPINE ER 30 MG PO TB24: 30.0000 mg | ORAL_TABLET | Freq: Every day | ORAL | 0 refills | Status: AC

## 2021-10-13 NOTE — Procedures (Signed)
Nexplanon Insertion Procedure Note After informed consent was obtained, the patient's non-dominant left arm was chosen for insertion. A site was marked approximately 8 cm proximal to the medial epicondyle in the sulcus between the biceps and triceps on the inner surface. The area was cleaned with alcohol then local anesthesia was infiltrated with 3 ml of 1% lidocaine along the planned insertion track. The area was prepped with betadine. Using sterile technique the Nexplanon device was inserted per manufacturer's guidelines in the subdermal connective tissue using the standard insertion technique without difficulty. Pressure was applied and the insertion site was hemostatic. The presence of the Nexplanon was confirmed immediately after insertion by palpation by both me and the patient and by checking the tip of needle for the absence of the insert.  A pressure dressing was applied.  Lot #: [id number] Exp: 2024 OCT  The patient tolerated the procedure well.  Cornelia Copa MD Attending Center for [employer] Midwife)

## 2021-10-13 NOTE — Discharge Instructions (Signed)
Hypertension During Pregnancy Hypertension is also called high blood pressure. High blood pressure means that the force of your blood moving in your body is too strong. It can cause problems for you and your baby. Different types of high blood pressure can happen during pregnancy. The types are:  High blood pressure before you got pregnant. This is called chronic hypertension.  This can continue during your pregnancy. Your doctor will want to keep checking your blood pressure. You may need medicine to keep your blood pressure under control while you are pregnant. You will need follow-up visits after you have your baby.  High blood pressure that goes up during pregnancy when it was normal before. This is called gestational hypertension. It will usually get better after you have your baby, but your doctor will need to watch your blood pressure to make sure that it is getting better.  Very high blood pressure during pregnancy. This is called preeclampsia. Very high blood pressure is an emergency that needs to be checked and treated right away.  You may develop very high blood pressure after giving birth. This is called postpartum preeclampsia. This usually occurs within 48 hours after childbirth but may occur up to 6 weeks after giving birth. This is rare. How does this affect me? If you have high blood pressure during pregnancy, you have a higher chance of developing high blood pressure:  As you get older.  If you get pregnant again. In some cases, high blood pressure during pregnancy can cause:  Stroke.  Heart attack.  Damage to the kidneys, lungs, or liver.  Preeclampsia.  Jerky movements you cannot control (convulsions or seizures).  Problems with the placenta.   What can I do to lower my risk?   Keep a healthy weight.  Eat a healthy diet.  Follow what your doctor tells you about treating any medical problems that you had before becoming pregnant. It is very important to go to  all of your doctor visits. Your doctor will check your blood pressure and make sure that your pregnancy is progressing as it should. Treatment should start early if a problem is found.   Follow these instructions at home:  Take your blood pressure 1-2 times per day. Call the office if your blood pressure is 155 or higher for the top number or 105 or higher for the bottom number.    Eating and drinking   Drink enough fluid to keep your pee (urine) pale yellow.  Avoid caffeine. Lifestyle  Do not use any products that contain nicotine or tobacco, such as cigarettes, e-cigarettes, and chewing tobacco. If you need help quitting, ask your doctor.  Do not use alcohol or drugs.  Avoid stress.  Rest and get plenty of sleep.  Regular exercise can help. Ask your doctor what kinds of exercise are best for you. General instructions  Take over-the-counter and prescription medicines only as told by your doctor.  Keep all prenatal and follow-up visits as told by your doctor. This is important. Contact a doctor if:  You have symptoms that your doctor told you to watch for, such as: ? Headaches. ? Nausea. ? Vomiting. ? Belly (abdominal) pain. ? Dizziness. ? Light-headedness. Get help right away if:  You have: ? Very bad belly pain that does not get better with treatment. ? A very bad headache that does not get better. ? Vomiting that does not get better. ? Sudden, fast weight gain. ? Sudden swelling in your hands, ankles, or face. ?   Blood in your pee. ? Blurry vision. ? Double vision. ? Shortness of breath. ? Chest pain. ? Weakness on one side of your body. ? Trouble talking. Summary  High blood pressure is also called hypertension.  High blood pressure means that the force of your blood moving in your body is too strong.  High blood pressure can cause problems for you and your baby.  Keep all follow-up visits as told by your doctor. This is important. This information is  not intended to replace advice given to you by your health care provider. Make sure you discuss any questions you have with your health care provider. Document Released: 12/21/2010 Document Revised: 03/11/2019 Document Reviewed: 12/15/2018 Elsevier Patient Education  2020 [employer]   

## 2021-10-17 ENCOUNTER — Ambulatory Visit (INDEPENDENT_AMBULATORY_CARE_PROVIDER_SITE_OTHER): Payer: Medicaid Other | Admitting: *Deleted

## 2021-10-17 ENCOUNTER — Encounter: Payer: Self-pay | Admitting: *Deleted

## 2021-10-17 ENCOUNTER — Other Ambulatory Visit: Payer: Self-pay

## 2021-10-17 VITALS — BP 140/84 | HR 92

## 2021-10-17 DIAGNOSIS — Z0130 Encounter for examination of blood pressure without abnormal findings: Secondary | ICD-10-CM

## 2021-10-17 NOTE — Progress Notes (Addendum)
   NURSE VISIT- BLOOD PRESSURE CHECK  SUBJECTIVE:  Ashley David is a 35 y.o. [phone] female here for BP check. She is postpartum, delivery date 10/11/21     HYPERTENSION ROS:  Postpartum:  Severe headaches that don't go away with tylenol/other medicines: No  Visual changes (seeing spots/double/blurred vision) No  Severe pain under right breast breast or in center of upper chest No  Severe nausea/vomiting No  Taking medicines as instructed yes   OBJECTIVE:  BP 140/84 (BP Location: Left Arm, Patient Position: Sitting, Cuff Size: Normal)   Pulse 92   LMP 02/05/2021 (Exact Date)   Breastfeeding No   Appearance alert, well appearing, and in no distress and oriented to person, place, and time.  ASSESSMENT: Postpartum  blood pressure check  PLAN: Discussed with Dr. Despina Hidden   Recommendations: stop medicine 2 days before next visit   Follow-up: as scheduled   Jobe Marker  10/17/2021 11:42 AM   Attestation of Attending Supervision of Nursing Visit Encounter: Evaluation and management procedures were performed by the nursing staff under my supervision and collaboration.  I have reviewed the nurse's note and chart, and I agree with the management and plan.  Rockne Coons MD Attending Physician for the [facility] 10/17/2021 2:23 PM

## 2021-10-23 ENCOUNTER — Telehealth (HOSPITAL_COMMUNITY): Payer: Self-pay

## 2021-10-23 NOTE — Telephone Encounter (Signed)
"  I'm doing ok." Patient has no questions or concerns about her healing.  "He is doing good. He is taking a nap now. He is growing pretty fast. His bilirubin level was good at the pediatrician. He has a whistling noise in his nose at times, is that normal? He is not coughing or having trouble breathing." RN told patient that baby may have dried mucous in his nose that she could try using the bulb syringe to remove it.  "He sleeps in a crib next to my bed."RN reviewed ABC's of safe sleep with patient. Patient declines any questions or concerns about baby.  EPDS score is 4.  Marcelino Duster [facility] 10/23/2021,1432

## 2021-11-14 ENCOUNTER — Encounter: Payer: Self-pay | Admitting: Advanced Practice Midwife

## 2021-11-14 ENCOUNTER — Ambulatory Visit (INDEPENDENT_AMBULATORY_CARE_PROVIDER_SITE_OTHER): Payer: Medicaid Other | Admitting: Advanced Practice Midwife

## 2021-11-14 ENCOUNTER — Other Ambulatory Visit: Payer: Self-pay

## 2021-11-14 DIAGNOSIS — F32A Depression, unspecified: Secondary | ICD-10-CM

## 2021-11-14 DIAGNOSIS — F419 Anxiety disorder, unspecified: Secondary | ICD-10-CM

## 2021-11-14 MED ORDER — SERTRALINE HCL 50 MG PO TABS: 50.0000 mg | ORAL_TABLET | Freq: Every day | ORAL | 3 refills | Status: AC

## 2021-11-14 NOTE — Progress Notes (Signed)
POSTPARTUM VISIT Patient name: Ashley David MRN [id number]  Date of birth: 1986/10/09 Chief Complaint:   Postpartum Care (Has nexplanon)  History of Present Illness:   Ashley David is a 35 y.o. G65P2103 Caucasian female being seen today for a postpartum visit. She is  4-5  weeks postpartum following a spontaneous vaginal delivery at 36.6 gestational weeks. IOL: yes, for preeclampsia with severe features. Anesthesia: local and epidural.  Laceration: 1st degree.  Complications: required 66YQI of mag sulfate therapy postpartum. Inpatient contraception: yes , Nexplanon placed .   Pregnancy complicated by pre-e with SF (vision/headache) . Tobacco use: yes. Substance use disorder: no. Reports being dizzy and with H/A in the mornings, but feels better after Tylenol and food. Last pap smear: June 2022 and results were NILM w/ HRHPV negative. Next pap smear due: June 2025 No LMP recorded.  Postpartum course has been complicated by requiring ProcardiaXL 42m for BP control . Bleeding spotting. Bowel function is  some constipation, takes stool softener . Bladder function is normal. Urinary incontinence? no, fecal incontinence? no Patient is not sexually active. Last sexual activity: prior to birth of baby. Desired contraception:  Nexplanon placed in hospital . Patient does not want a pregnancy in the future.  Desired family size is 3 children.   Upstream - 11/14/21 1348       Pregnancy Intention Screening   Does the patient want to become pregnant in the next year? No    Does the patient's partner want to become pregnant in the next year? No    Would the patient like to discuss contraceptive options today? No      Contraception Wrap Up   Current Method Hormonal Implant    End Method Hormonal Implant    Contraception Counseling Provided No            The pregnancy intention screening data noted above was reviewed. Potential methods of contraception were discussed. The patient elected to  proceed with Hormonal Implant.  Edinburgh Postpartum Depression Screening: positive: H/O mental health disorder: yes hx anx/dep. Currently on meds: no.  Currently in therapy: no.  Sleeping: typical w newborn.  Appetite: decreased.  Still finds joy in things she used to: Yes.  Support at home: husband.  SI/HI/II: no.  Interested in medicine: Yes.  Interested in therapy: No.  Edinburgh Postnatal Depression Scale - 11/14/21 1355       Edinburgh Postnatal Depression Scale:  In the Past 7 Days   I have been able to laugh and see the funny side of things. 0    I have looked forward with enjoyment to things. 0    I have blamed myself unnecessarily when things went wrong. 3    I have been anxious or worried for no good reason. 2    I have felt scared or panicky for no good reason. 1    Things have been getting on top of me. 2    I have been so unhappy that I have had difficulty sleeping. 0    I have felt sad or miserable. 0    I have been so unhappy that I have been crying. 1    The thought of harming myself has occurred to me. 0    Edinburgh Postnatal Depression Scale Total 9             GAD 7 : Generalized Anxiety Score 08/14/2021 05/16/2021  Nervous, Anxious, on Edge 1 1  Control/stop worrying 1 1  Worry too much -  different things 1 1  Trouble relaxing 1 1  Restless 1 0  Easily annoyed or irritable 3 2  Afraid - awful might happen 0 0  Total GAD 7 Score 8 6     Baby's course has been uncomplicated. Baby is feeding by bottle. Infant has a pediatrician/family doctor? Yes.  Childcare strategy if returning to work/school: family.  Pt has material needs met for her and baby: Yes.   Review of Systems:   Pertinent items are noted in HPI Denies Abnormal vaginal discharge w/ itching/odor/irritation, headaches, visual changes, shortness of breath, chest pain, abdominal pain, severe nausea/vomiting, or problems with urination or bowel movements. Pertinent History Reviewed:  Reviewed past  medical,surgical, obstetrical and family history.  Reviewed problem list, medications and allergies. OB History  Gravida Para Term Preterm AB Living  _0 SAB IAB Ectopic Multiple Live Births        0 3    # Outcome Date GA Lbr Len/2nd Weight Sex Delivery Anes PTL Lv  3 Preterm 10/11/21 8w6d13:12 / 00:27 5 lb 11 oz (2.58 kg) M Vag-Spont EPI  LIV  2 Term 01/22/10 468w0d7 lb (3.175 kg) M Vag-Spont EPI  LIV  1 Term 08/17/08 4142w0d lb (3.175 kg) F Vag-Spont EPI N LIV   Physical Assessment:  There were no vitals filed for this visit.There is no height or weight on file to calculate BMI.       Physical Examination:   General appearance: alert, well appearing, and in no distress  Mental status: alert, oriented to person, place, and time  Skin: warm & dry   Cardiovascular: normal heart rate noted   Respiratory: normal respiratory effort, no distress   Breasts: deferred, no complaints   Abdomen: soft, non-tender   Pelvic: normal external genitalia, vulva, vagina, cervix, uterus and adnexa. Thin prep pap obtained: No  Rectal: not examined  Extremities: Edema: none         No results found for this or any previous visit (from the past 24 hour(s)).  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  Mood and well being:  If positive depression screen, discussed and plan developed.  If using tobacco we discussed reduction/cessation and risk of relapse If current substance abuse, we discussed and referral to local resources was  offered.   2. Infant care and feeding:  If breastfeeding, discussed returning to work, pumping, breastfeeding-associated pain, guidance regarding return to fertility while lactating if not using another method. If needed, patient was provided with a letter to be allowed to pump q 2-3hrs to support lactation in a private location with access to a refrigerator to store breastmilk.   Recommended that all caregivers be immunized for flu, pertussis and other preventable communicable diseases If pt does not have material needs met for her/baby, referred to local resources for help obtaining these.  3. Sexuality, contraception and birth spacing Provided guidance regarding sexuality, management of dyspareunia, and resumption of intercourse Discussed avoiding  interpregnancy interval <69mhs and recommended birth spacing of 18 months  4. Sleep and fatigue Discussed coping options for fatigue and sleep disruption Encouraged family/partner/community support of 4 hrs of uninterrupted sleep to help with mood and fatigue  5. Physical recovery  If pt had a C/S, assessed incisional pain and providing guidance on normal vs prolonged recovery If pt had a laceration, perineal healing and pain reviewed.  If urinary or fecal incontinence, discussed management and referred to PT or uro/gyn if indicated  Patient is safe to resume physical activity. Discussed attainment of healthy weight.  6.  Chronic disease management Discussed pregnancy complications if any, and their implications for future childbearing and long-term maternal health. Review recommendations for prevention of recurrent pregnancy complications, such as 17 hydroxyprogesterone caproate to reduce risk for recurrent PTB not applicable, or aspirin to reduce risk of preeclampsia yes. Pt had GDM: no. If yes, 2hr GTT scheduled: not applicable. Reviewed medications and non-pregnant dosing including consideration of whether pt is breastfeeding using a  reliable resource such as LactMed: not applicable Referred for f/u w/ PCP or subspecialist providers as indicated: not applicable  7. Health maintenance Mammogram at 447yoor earlier if indicated Pap smears as indicated  Meds:  Meds ordered this encounter  Medications   sertraline (ZOLOFT) 50 MG tablet    Sig: Take 1 tablet (50 mg total) by mouth daily.    Dispense:  30 tablet    Refill:  3    Order Specific Question:   Supervising Provider    Answer:   OJanyth Pupa[[[id number]   Follow-up: Return in about 3 months (around 02/12/2022) for depression f/u.   No orders of the defined types were placed in this encounter.   KMyrtis SerCNM 11/14/2021 2:12 PM

## 2021-11-21 IMAGING — CT CT ABD-PELV W/ CM
2 of 4 series · 17 of 46 positions shown, 19 images · IV contrast (Omnipaque or Isovue)
Comparison: CT abdomen pelvis dated 06/04/2019.

CLINICAL DATA: 33-year-old female with abdominal pain.

EXAM:
CT ABDOMEN AND PELVIS WITH CONTRAST
TECHNIQUE: Multidetector CT imaging of the abdomen and pelvis was performed
using the standard protocol following bolus administration of
intravenous contrast.
CONTRAST:  75mL OMNIPAQUE IOHEXOL 300 MG/ML  SOLN

[Series 2: axial st · axial · 0.77mm/px · z∈[-477,-92]mm · 14 of 85 slices shown, 16 images]
[im 4/85  soft-tissue]
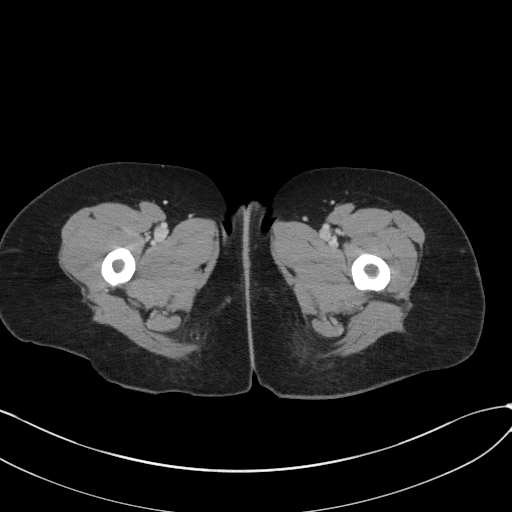
[im 4/85  bone]
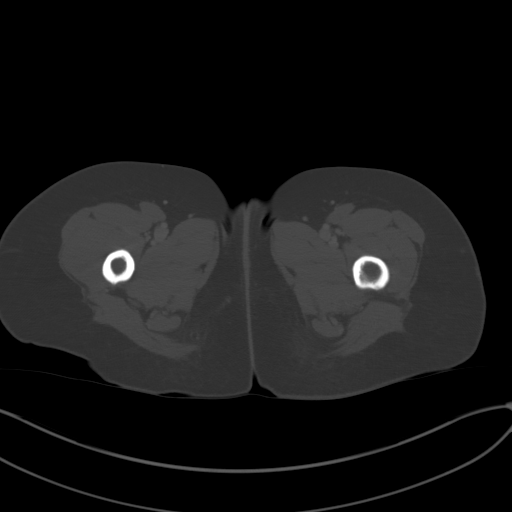
[im 12/85  soft-tissue]
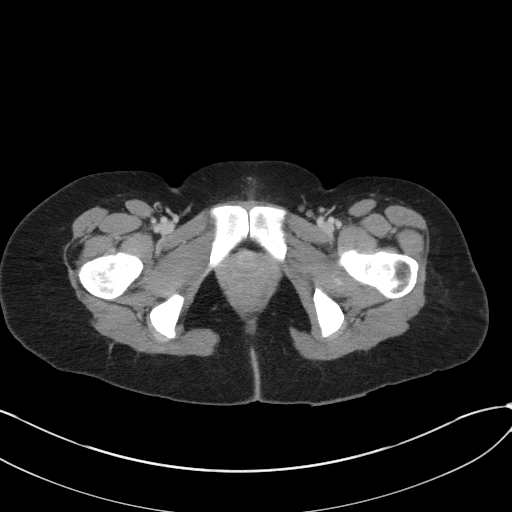
[im 16/85  soft-tissue]
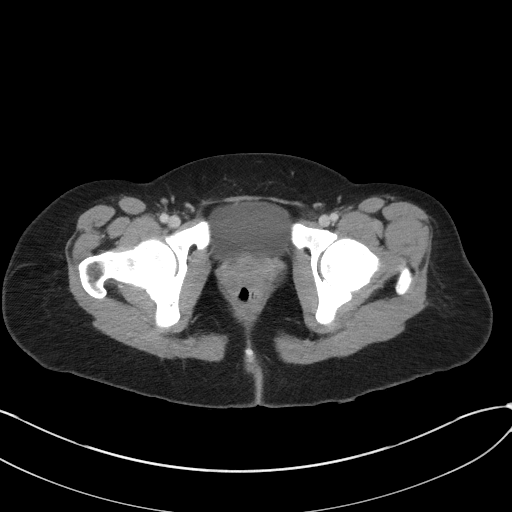
[im 23/85  soft-tissue]
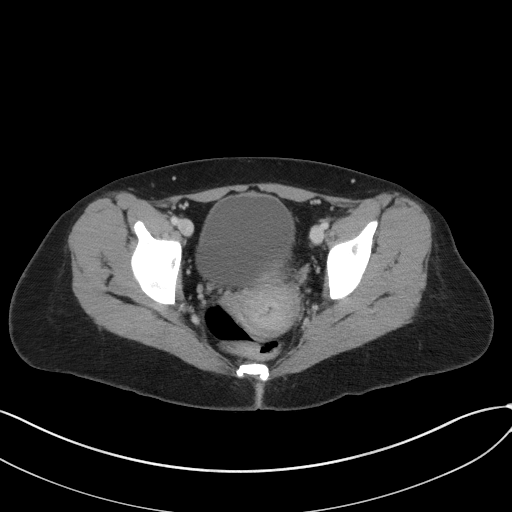
[im 27/85  soft-tissue]
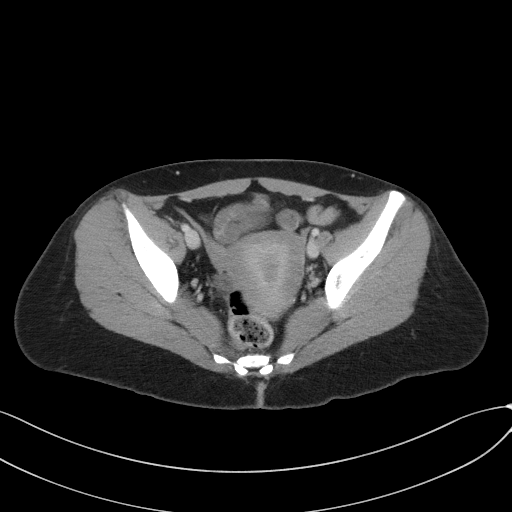
[im 35/85  soft-tissue]
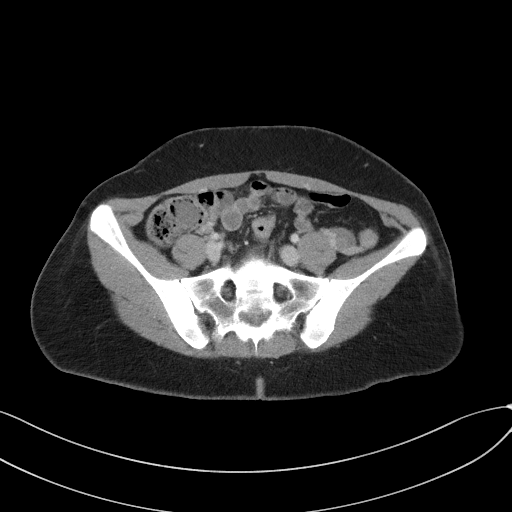
[im 39/85  soft-tissue]
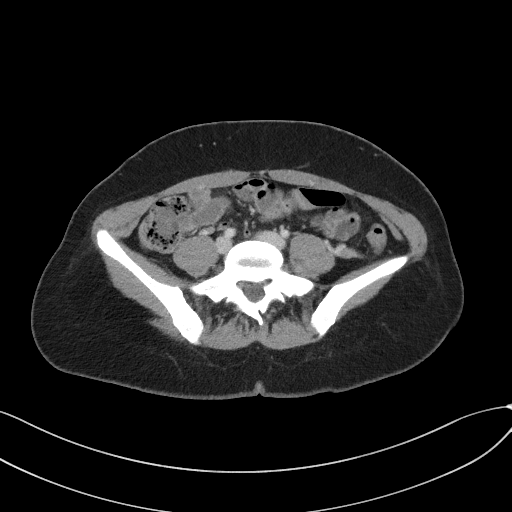
[im 46/85  soft-tissue]
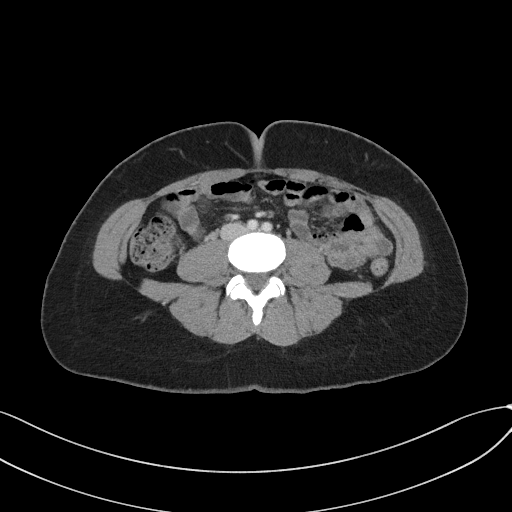
[im 50/85  soft-tissue]
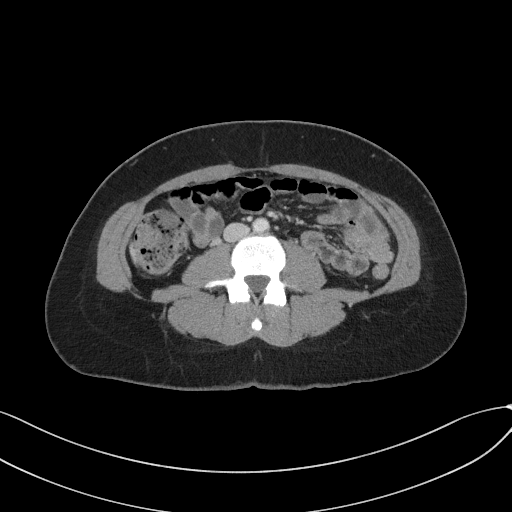
[im 50/85  bone]
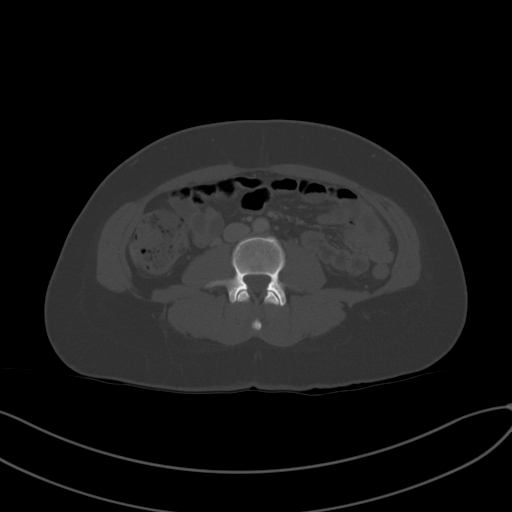
[im 58/85  soft-tissue]
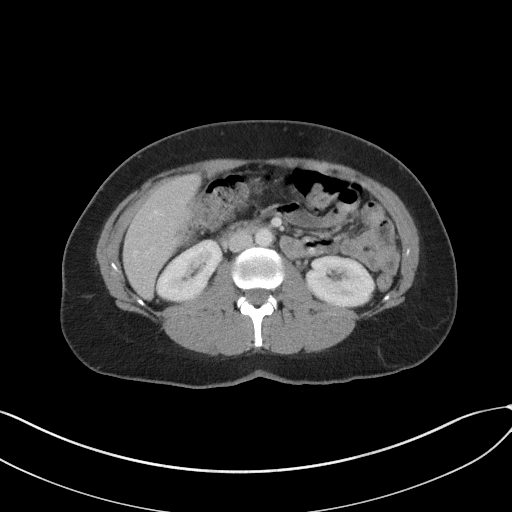
[im 62/85  soft-tissue]
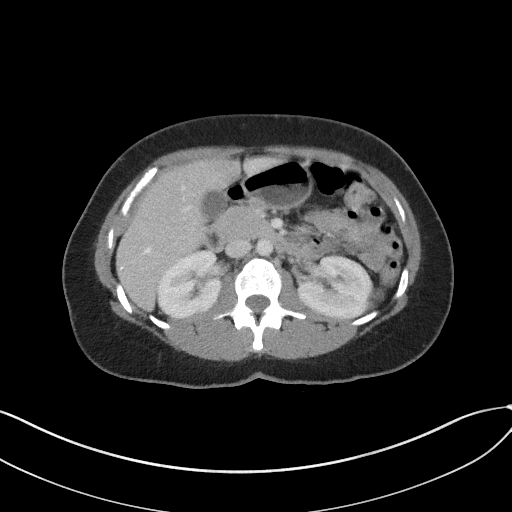
[im 69/85  soft-tissue]
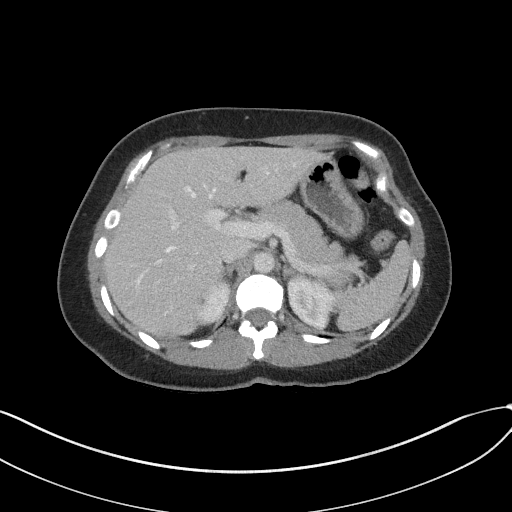
[im 73/85  soft-tissue]
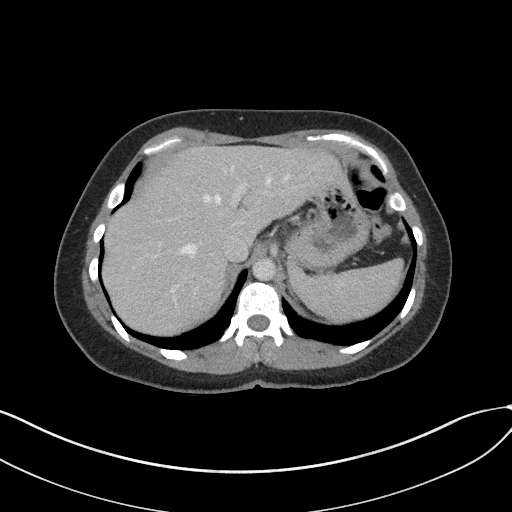
[im 81/85  soft-tissue]
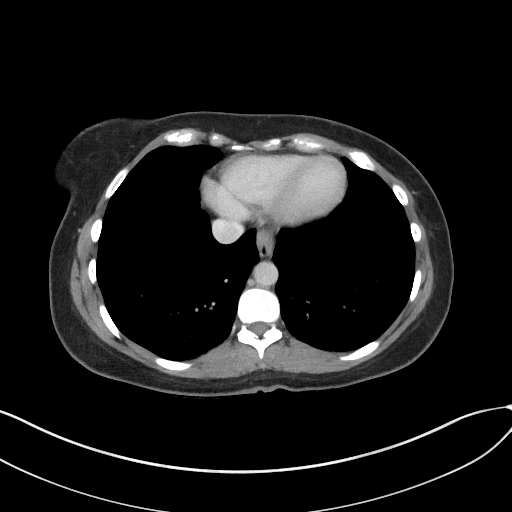

[Series 5: coronal st · coronal · 0.75mm/px · 3 of 84 slices shown]
[im 28/84  soft-tissue]
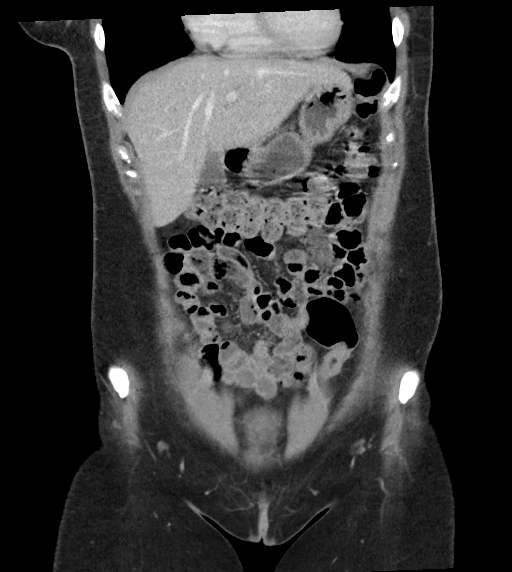
[im 37/84  soft-tissue]
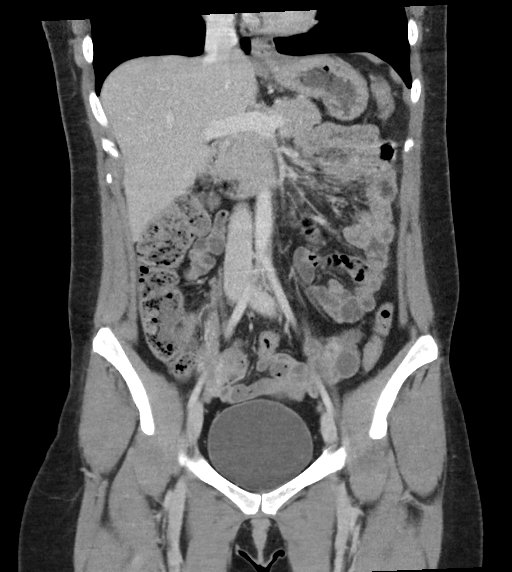
[im 47/84  soft-tissue]
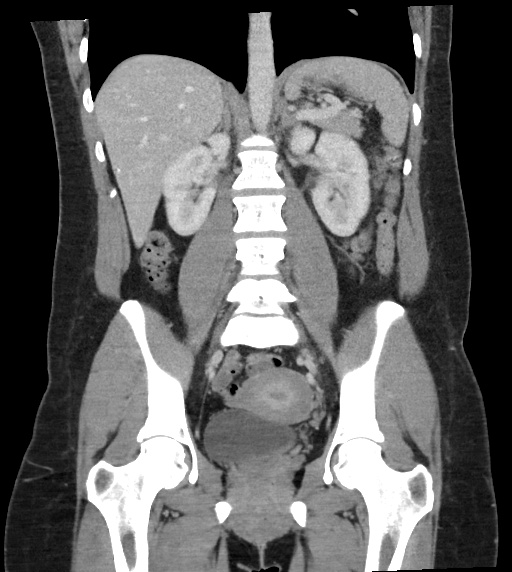

[17 of 46 positions shown; findings below may reference images not displayed]

FINDINGS: Lower chest: Minimal bibasilar dependent atelectasis. The visualized
lung bases are otherwise clear.

No intra-abdominal free air or free fluid.

Hepatobiliary: No focal liver abnormality is seen. No gallstones,
gallbladder wall thickening, or biliary dilatation.

Pancreas: Unremarkable. No pancreatic ductal dilatation or
surrounding inflammatory changes.

Spleen: Normal in size without focal abnormality.

Adrenals/Urinary Tract: Adrenal glands are unremarkable. Kidneys are
normal, without renal calculi, focal lesion, or hydronephrosis.
Bladder is unremarkable.

Stomach/Bowel: There is moderate stool throughout the colon. There
is no bowel obstruction or active inflammation. The appendix is
normal.

Vascular/Lymphatic: The abdominal aorta and IVC are unremarkable. No
portal venous gas. There is no adenopathy.

Reproductive: The uterus is anteverted and appears unremarkable. No
adnexal masses.

Other: None

Musculoskeletal: No acute or significant osseous findings.
IMPRESSION: 1. No acute intra-abdominal or pelvic pathology.
2. Moderate colonic stool burden. No bowel obstruction. Normal
appendix.

## 2022-02-12 ENCOUNTER — Ambulatory Visit: Payer: Medicaid Other | Admitting: Women's Health
